# Patient Record
Sex: Female | Born: 1960 | Race: Black or African American | Hispanic: No | Marital: Single | Smoking: Never smoker
Health system: Southern US, Community
[De-identification: ages and names within clinical notes are randomized; demographics above are authoritative.]

## PROBLEM LIST (undated history)

## (undated) DIAGNOSIS — N200 Calculus of kidney: Secondary | ICD-10-CM

---

## 2000-02-15 ENCOUNTER — Other Ambulatory Visit: Admission: RE | Admit: 2000-02-15 | Discharge: 2000-02-15 | Payer: Self-pay | Admitting: Gynecology

## 2002-04-15 ENCOUNTER — Emergency Department (HOSPITAL_COMMUNITY): Admission: EM | Admit: 2002-04-15 | Discharge: 2002-04-15 | Payer: Self-pay | Admitting: Podiatry

## 2004-05-26 ENCOUNTER — Emergency Department (HOSPITAL_COMMUNITY): Admission: EM | Admit: 2004-05-26 | Discharge: 2004-05-26 | Payer: Self-pay | Admitting: Emergency Medicine

## 2004-09-22 ENCOUNTER — Encounter: Admission: RE | Admit: 2004-09-22 | Discharge: 2004-09-22 | Payer: Self-pay | Admitting: Emergency Medicine

## 2007-01-20 ENCOUNTER — Emergency Department (HOSPITAL_COMMUNITY): Admission: EM | Admit: 2007-01-20 | Discharge: 2007-01-20 | Payer: Self-pay | Admitting: Emergency Medicine

## 2008-04-16 ENCOUNTER — Emergency Department (HOSPITAL_COMMUNITY): Admission: EM | Admit: 2008-04-16 | Discharge: 2008-04-16 | Payer: Self-pay | Admitting: Emergency Medicine

## 2009-09-13 ENCOUNTER — Emergency Department (HOSPITAL_COMMUNITY): Admission: EM | Admit: 2009-09-13 | Discharge: 2009-09-14 | Payer: Self-pay | Admitting: Emergency Medicine

## 2009-10-25 ENCOUNTER — Emergency Department (HOSPITAL_COMMUNITY): Admission: EM | Admit: 2009-10-25 | Discharge: 2009-10-25 | Payer: Self-pay | Admitting: Emergency Medicine

## 2010-05-16 LAB — CBC
HCT: 36.1 % (ref 36.0–46.0)
MCH: 26.5 pg (ref 26.0–34.0)
MCHC: 32.8 g/dL (ref 30.0–36.0)
Platelets: 243 10*3/uL (ref 150–400)
WBC: 5.9 10*3/uL (ref 4.0–10.5)

## 2010-05-16 LAB — DIFFERENTIAL
Eosinophils Absolute: 0.1 10*3/uL (ref 0.0–0.7)
Monocytes Absolute: 0.5 10*3/uL (ref 0.1–1.0)
Monocytes Relative: 8 % (ref 3–12)
Neutrophils Relative %: 64 % (ref 43–77)

## 2010-05-16 LAB — BASIC METABOLIC PANEL
CO2: 27 mEq/L (ref 19–32)
Calcium: 8.5 mg/dL (ref 8.4–10.5)
Chloride: 104 mEq/L (ref 96–112)
Creatinine, Ser: 1.01 mg/dL (ref 0.4–1.2)
GFR calc non Af Amer: 58 mL/min — ABNORMAL LOW (ref >60)
Potassium: 4.5 mEq/L (ref 3.5–5.1)
Sodium: 136 mEq/L (ref 135–145)

## 2010-05-16 LAB — URINALYSIS, ROUTINE W REFLEX MICROSCOPIC
Glucose, UA: NEGATIVE mg/dL
Hgb urine dipstick: NEGATIVE
Ketones, ur: NEGATIVE mg/dL
Protein, ur: NEGATIVE mg/dL
Specific Gravity, Urine: 1.022 (ref 1.005–1.030)
Urobilinogen, UA: 0.2 mg/dL (ref 0.0–1.0)
pH: 6.5 (ref 5.0–8.0)

## 2010-12-08 LAB — URINALYSIS, ROUTINE W REFLEX MICROSCOPIC
Nitrite: NEGATIVE
Specific Gravity, Urine: 1.025
Urobilinogen, UA: 1
pH: 7.5

## 2010-12-08 LAB — POCT PREGNANCY, URINE
Operator id: 284251
Preg Test, Ur: NEGATIVE

## 2014-12-25 ENCOUNTER — Emergency Department (HOSPITAL_COMMUNITY)
Admission: EM | Admit: 2014-12-25 | Discharge: 2014-12-25 | Disposition: A | Discharge status: Home / Self Care | Payer: Self-pay | Attending: Emergency Medicine | Admitting: Emergency Medicine

## 2014-12-25 ENCOUNTER — Encounter (HOSPITAL_COMMUNITY): Payer: Self-pay | Admitting: *Deleted

## 2014-12-25 ENCOUNTER — Emergency Department (HOSPITAL_COMMUNITY): Payer: Self-pay

## 2014-12-25 DIAGNOSIS — Y92410 Unspecified street and highway as the place of occurrence of the external cause: Secondary | ICD-10-CM | POA: Insufficient documentation

## 2014-12-25 DIAGNOSIS — R51 Headache: Secondary | ICD-10-CM

## 2014-12-25 DIAGNOSIS — R519 Headache, unspecified: Secondary | ICD-10-CM

## 2014-12-25 DIAGNOSIS — Y998 Other external cause status: Secondary | ICD-10-CM | POA: Insufficient documentation

## 2014-12-25 DIAGNOSIS — S161XXA Strain of muscle, fascia and tendon at neck level, initial encounter: Secondary | ICD-10-CM | POA: Insufficient documentation

## 2014-12-25 DIAGNOSIS — Y9389 Activity, other specified: Secondary | ICD-10-CM | POA: Insufficient documentation

## 2014-12-25 DIAGNOSIS — S0990XA Unspecified injury of head, initial encounter: Secondary | ICD-10-CM | POA: Insufficient documentation

## 2014-12-25 MED ORDER — CYCLOBENZAPRINE HCL 5 MG PO TABS
5.0000 mg | ORAL_TABLET | Freq: Two times a day (BID) | ORAL | Status: DC | PRN
Start: 2014-12-25 — End: 2015-06-10

## 2014-12-25 MED ORDER — IBUPROFEN 400 MG PO TABS
600.0000 mg | ORAL_TABLET | Freq: Once | ORAL | Status: AC
  Administered 2014-12-25: 400 mg via ORAL
  Filled 2014-12-25: qty 2
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
## 2014-12-25 NOTE — Discharge Instructions (Signed)
Cervical Sprain  A cervical sprain is an injury in the neck in which the strong, fibrous tissues (ligaments) that connect your neck bones stretch or tear. Cervical sprains can range from mild to severe. Severe cervical sprains can cause the neck vertebrae to be unstable. This can lead to damage of the spinal cord and can result in serious nervous system problems. The amount of time it takes for a cervical sprain to get better depends on the cause and extent of the injury. Most cervical sprains heal in 1 to 3 weeks.  CAUSES   Severe cervical sprains may be caused by:    Contact sport injuries (such as from football, rugby, wrestling, hockey, auto racing, gymnastics, diving, martial arts, or boxing).    Motor vehicle collisions.    Whiplash injuries. This is an injury from a sudden forward and backward whipping movement of the head and neck.   Falls.   Mild cervical sprains may be caused by:    Being in an awkward position, such as while cradling a telephone between your ear and shoulder.    Sitting in a chair that does not offer proper support.    Working at a poorly designed computer station.    Looking up or down for long periods of time.   SYMPTOMS    Pain, soreness, stiffness, or a burning sensation in the front, back, or sides of the neck. This discomfort may develop immediately after the injury or slowly, 24 hours or more after the injury.    Pain or tenderness directly in the middle of the back of the neck.    Shoulder or upper back pain.    Limited ability to move the neck.    Headache.    Dizziness.    Weakness, numbness, or tingling in the hands or arms.    Muscle spasms.    Difficulty swallowing or chewing.    Tenderness and swelling of the neck.   DIAGNOSIS   Most of the time your health care provider can diagnose a cervical sprain by taking your history and doing a physical exam. Your health care provider will ask about previous neck injuries and any known neck  problems, such as arthritis in the neck. X-rays may be taken to find out if there are any other problems, such as with the bones of the neck. Other tests, such as a CT scan or MRI, may also be needed.   TREATMENT   Treatment depends on the severity of the cervical sprain. Mild sprains can be treated with rest, keeping the neck in place (immobilization), and pain medicines. Severe cervical sprains are immediately immobilized. Further treatment is done to help with pain, muscle spasms, and other symptoms and may include:   Medicines, such as pain relievers, numbing medicines, or muscle relaxants.    Physical therapy. This may involve stretching exercises, strengthening exercises, and posture training. Exercises and improved posture can help stabilize the neck, strengthen muscles, and help stop symptoms from returning.   HOME CARE INSTRUCTIONS    Put ice on the injured area.     Put ice in a plastic bag.     Place a towel between your skin and the bag.     Leave the ice on for 15-20 minutes, 3-4 times a day.    If your injury was severe, you may have been given a cervical collar to wear. A cervical collar is a two-piece collar designed to keep your neck from moving while it heals.      Do not remove the collar unless instructed by your health care provider.    If you have long hair, keep it outside of the collar.    Ask your health care provider before making any adjustments to your collar. Minor adjustments may be required over time to improve comfort and reduce pressure on your chin or on the back of your head.    Ifyou are allowed to remove the collar for cleaning or bathing, follow your health care provider's instructions on how to do so safely.    Keep your collar clean by wiping it with mild soap and water and drying it completely. If the collar you have been given includes removable pads, remove them every 1-2 days and hand wash them with soap and water. Allow them to air dry. They should be completely  dry before you wear them in the collar.    If you are allowed to remove the collar for cleaning and bathing, wash and dry the skin of your neck. Check your skin for irritation or sores. If you see any, tell your health care provider.    Do not drive while wearing the collar.    Only take over-the-counter or prescription medicines for pain, discomfort, or fever as directed by your health care provider.    Keep all follow-up appointments as directed by your health care provider.    Keep all physical therapy appointments as directed by your health care provider.    Make any needed adjustments to your workstation to promote good posture.    Avoid positions and activities that make your symptoms worse.    Warm up and stretch before being active to help prevent problems.   SEEK MEDICAL CARE IF:    Your pain is not controlled with medicine.    You are unable to decrease your pain medicine over time as planned.    Your activity level is not improving as expected.   SEEK IMMEDIATE MEDICAL CARE IF:    You develop any bleeding.   You develop stomach upset.   You have signs of an allergic reaction to your medicine.    Your symptoms get worse.    You develop new, unexplained symptoms.    You have numbness, tingling, weakness, or paralysis in any part of your body.   MAKE SURE YOU:    Understand these instructions.   Will watch your condition.   Will get help right away if you are not doing well or get worse.     This information is not intended to replace advice given to you by your health care provider. Make sure you discuss any questions you have with your health care provider.     Document Released: 12/13/2006 Document Revised: 02/20/2013 Document Reviewed: 08/23/2012  Elsevier Interactive Patient Education 2016 [employer]

## 2014-12-25 NOTE — ED Notes (Signed)
Patient transported to X-ray 
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
## 2014-12-25 NOTE — ED Notes (Signed)
Pt is in stable condition upon d/c and ambulates from ED. 

## 2015-06-10 ENCOUNTER — Ambulatory Visit (HOSPITAL_COMMUNITY)
Admission: EM | Admit: 2015-06-10 | Discharge: 2015-06-10 | Disposition: A | Discharge status: Home / Self Care | Payer: No Typology Code available for payment source | Attending: Family Medicine | Admitting: Family Medicine

## 2015-06-10 ENCOUNTER — Encounter (HOSPITAL_COMMUNITY): Payer: Self-pay | Admitting: Emergency Medicine

## 2015-06-10 DIAGNOSIS — M545 Low back pain, unspecified: Secondary | ICD-10-CM

## 2015-06-10 LAB — POCT URINALYSIS DIP (DEVICE)
BILIRUBIN URINE: NEGATIVE
GLUCOSE, UA: NEGATIVE mg/dL
Hgb urine dipstick: NEGATIVE
KETONES UR: NEGATIVE mg/dL
Nitrite: NEGATIVE
Protein, ur: NEGATIVE mg/dL
Specific Gravity, Urine: 1.02 (ref 1.005–1.030)
Urobilinogen, UA: 0.2 mg/dL (ref 0.0–1.0)
pH: 6 (ref 5.0–8.0)

## 2015-06-10 MED ORDER — CYCLOBENZAPRINE HCL 5 MG PO TABS: 5.0000 mg | ORAL_TABLET | Freq: Three times a day (TID) | ORAL | Status: DC | PRN

## 2015-06-10 NOTE — ED Provider Notes (Signed)
CSN: [id number]     Arrival date & time 06/10/15  1257 History   First MD Initiated Contact with Patient 06/10/15 1312     Chief Complaint  Patient presents with  . Back Pain   (Consider location/radiation/quality/duration/timing/severity/associated sxs/prior Treatment) Patient is a 55 y.o. female presenting with back pain. The history is provided by the patient.  Back Pain Location:  Lumbar spine Quality:  Stiffness Radiates to:  Does not radiate Pain severity:  Mild Onset quality:  Gradual Duration:  3 days Progression:  Unchanged Chronicity:  New Context: not lifting heavy objects and not recent injury   Worsened by:  Nothing tried Ineffective treatments:  None tried Associated symptoms: no abdominal pain, no bladder incontinence, no bowel incontinence, no dysuria, no fever, no leg pain, no numbness, no paresthesias, no perianal numbness, no tingling and no weakness     History reviewed. No pertinent past medical history. History reviewed. No pertinent past surgical history. No family history on file. Social History  Substance Use Topics  . Smoking status: Never Smoker   . Smokeless tobacco: None  . Alcohol Use: No   OB History    No data available     Review of Systems  Constitutional: Negative.  Negative for fever.  Gastrointestinal: Negative.  Negative for abdominal pain and bowel incontinence.  Genitourinary: Negative.  Negative for bladder incontinence and dysuria.  Musculoskeletal: Positive for back pain. Negative for myalgias and gait problem.  Skin: Negative.   Neurological: Negative for tingling, weakness, numbness and paresthesias.  All other systems reviewed and are negative.   Allergies  Review of patient's allergies indicates no known allergies.  Home Medications   Prior to Admission medications   Medication Sig Start Date End Date Taking? Authorizing Provider  cyclobenzaprine (FLEXERIL) 5 MG tablet Take 1 tablet (5 mg total) by mouth 3 (three)  times daily as needed for muscle spasms. 06/10/15   Linna HoffJames D Erickson Yamashiro, MD   Meds Ordered and Administered this Visit  Medications - No data to display  BP 142/89 mmHg  Pulse 104  Temp(Src) 98 F (36.7 C) (Oral)  Resp 16  SpO2 98% No data found.   Physical Exam  Constitutional: She is oriented to person, place, and time. She appears well-developed and well-nourished. No distress.  Abdominal: Soft. Bowel sounds are normal.  Musculoskeletal: She exhibits tenderness.       Lumbar back: She exhibits tenderness, pain and spasm. She exhibits no bony tenderness, no swelling, no edema and normal pulse.       Back:  Neurological: She is alert and oriented to person, place, and time.  Skin: Skin is warm and dry.  Nursing note and vitals reviewed.   ED Course  Procedures (including critical care time)  Labs Review Labs Reviewed  POCT URINALYSIS DIP (DEVICE) - Abnormal; Notable for the following:    Leukocytes, UA TRACE (*)    All other components within normal limits    Imaging Review No results found.   Visual Acuity Review  Right Eye Distance:   Left Eye Distance:   Bilateral Distance:    Right Eye Near:   Left Eye Near:    Bilateral Near:         MDM   1. Back pain at L4-L5 level        Linna HoffJames D Rylinn Linzy, MD 06/12/15 2144

## 2015-06-10 NOTE — ED Notes (Signed)
Here with 3 days right side back pain Steady dull ache pain noted Denies hematuria or n,v

## 2018-08-02 ENCOUNTER — Emergency Department (HOSPITAL_COMMUNITY)
Admission: EM | Admit: 2018-08-02 | Discharge: 2018-08-03 | Disposition: A | Discharge status: Home / Self Care | Payer: Self-pay | Attending: Emergency Medicine | Admitting: Emergency Medicine

## 2018-08-02 ENCOUNTER — Other Ambulatory Visit: Payer: Self-pay

## 2018-08-02 ENCOUNTER — Encounter (HOSPITAL_COMMUNITY): Payer: Self-pay | Admitting: Emergency Medicine

## 2018-08-02 DIAGNOSIS — K2960 Other gastritis without bleeding: Secondary | ICD-10-CM | POA: Insufficient documentation

## 2018-08-02 HISTORY — DX: Calculus of kidney: N20.0

## 2018-08-02 LAB — COMPREHENSIVE METABOLIC PANEL
ALT: 48 U/L — ABNORMAL HIGH (ref 0–44)
AST: 53 U/L — ABNORMAL HIGH (ref 15–41)
Albumin: 3.7 g/dL (ref 3.5–5.0)
Alkaline Phosphatase: 81 U/L (ref 38–126)
Anion gap: 9 (ref 5–15)
BUN: 16 mg/dL (ref 6–20)
CO2: 25 mmol/L (ref 22–32)
Calcium: 9 mg/dL (ref 8.9–10.3)
Chloride: 104 mmol/L (ref 98–111)
Creatinine, Ser: 0.96 mg/dL (ref 0.44–1.00)
GFR calc Af Amer: >60 mL/min (ref >60)
GFR calc non Af Amer: >60 mL/min (ref >60)
Glucose, Bld: 109 mg/dL — ABNORMAL HIGH (ref 70–99)
Potassium: 4.2 mmol/L (ref 3.5–5.1)
Sodium: 138 mmol/L (ref 135–145)
Total Bilirubin: 0.4 mg/dL (ref 0.3–1.2)
Total Protein: 7.4 g/dL (ref 6.5–8.1)

## 2018-08-02 LAB — URINALYSIS, ROUTINE W REFLEX MICROSCOPIC
Bilirubin Urine: NEGATIVE
Glucose, UA: NEGATIVE mg/dL
Hgb urine dipstick: NEGATIVE
Ketones, ur: NEGATIVE mg/dL
Nitrite: NEGATIVE
Protein, ur: NEGATIVE mg/dL
Specific Gravity, Urine: 1.02 (ref 1.005–1.030)
pH: 6 (ref 5.0–8.0)

## 2018-08-02 LAB — CBC
HCT: 37.7 % (ref 36.0–46.0)
Hemoglobin: 11.5 g/dL — ABNORMAL LOW (ref 12.0–15.0)
MCH: 25.1 pg — ABNORMAL LOW (ref 26.0–34.0)
MCHC: 30.5 g/dL (ref 30.0–36.0)
MCV: 82.1 fL (ref 80.0–100.0)
Platelets: 301 10*3/uL (ref 150–400)
RBC: 4.59 MIL/uL (ref 3.87–5.11)
RDW: 14.3 % (ref 11.5–15.5)
WBC: 3.7 10*3/uL — ABNORMAL LOW (ref 4.0–10.5)
nRBC: 0 % (ref 0.0–0.2)

## 2018-08-02 LAB — I-STAT BETA HCG BLOOD, ED (MC, WL, AP ONLY): I-stat hCG, quantitative: <5 m[IU]/mL (ref <5)

## 2018-08-02 LAB — LIPASE, BLOOD: Lipase: 29 U/L (ref 11–51)

## 2018-08-02 MED ORDER — LIDOCAINE VISCOUS HCL 2 % MT SOLN
15.0000 mL | Freq: Once | OROMUCOSAL | Status: AC
  Administered 2018-08-02: 15 mL via ORAL
  Filled 2018-08-02: qty 15

## 2018-08-02 MED ORDER — ALUM & MAG HYDROXIDE-SIMETH 200-200-20 MG/5ML PO SUSP
30.0000 mL | Freq: Once | ORAL | Status: AC
  Administered 2018-08-02: 30 mL via ORAL
  Filled 2018-08-02: qty 30

## 2018-08-02 MED ORDER — SODIUM CHLORIDE 0.9% FLUSH: 3.0000 mL | Freq: Once | INTRAVENOUS | Status: DC

## 2018-08-02 NOTE — ED Provider Notes (Signed)
[facility] EMERGENCY DEPARTMENT Provider Note   CSN: [id number] Arrival date & time: 08/02/18  1642    History   Chief Complaint Chief Complaint  Patient presents with  . Abdominal Pain    HPI Jasmine Lane is a 58 y.o. female.     Patient without other medical history presents with epigastric pain x at least 2 weeks. The pain fluctuates, coming and going, without identified pattern or modifying factors.  She denies chest pain, SOB, cough, fever. The pain is sharp and radiates to back. No nausea or vomiting. She has been taking Mylanta and TUMs with decreasing relief. She reports increased belching. She describes history of burning sensation in her throat that affected her voice and over time this migrated to her epigastrium.   The history is provided by the patient. No language interpreter was used.  Abdominal Pain  Associated symptoms: no chest pain, no chills, no constipation, no cough, no diarrhea, no fever, no nausea, no shortness of breath and no vomiting     Past Medical History:  Diagnosis Date  . Kidney stone     There are no active problems to display for this patient.   History reviewed. No pertinent surgical history.   OB History   No obstetric history on file.      Home Medications    Prior to Admission medications   Medication Sig Start Date End Date Taking? Authorizing Provider  cyclobenzaprine (FLEXERIL) 5 MG tablet Take 1 tablet (5 mg total) by mouth 3 (three) times daily as needed for muscle spasms. 06/10/15   Linna Hoff, MD    Family History No family history on file.  Social History Social History   Tobacco Use  . Smoking status: Never Smoker  . Smokeless tobacco: Never Used  Substance Use Topics  . Alcohol use: No  . Drug use: No     Allergies   Patient has no known allergies.   Review of Systems Review of Systems  Constitutional: Negative for chills and fever.  HENT: Negative.   Respiratory: Negative.   Negative for cough and shortness of breath.   Cardiovascular: Negative.  Negative for chest pain.  Gastrointestinal: Positive for abdominal pain. Negative for constipation, diarrhea, nausea and vomiting.  Musculoskeletal: Negative.   Skin: Negative.   Neurological: Negative.      Physical Exam Updated Vital Signs BP (!) 157/84 (BP Location: Right Arm)   Pulse 79   Temp 97.9 F (36.6 C) (Oral)   Resp 18   SpO2 99%   Physical Exam Vitals signs and nursing note reviewed.  Constitutional:      Appearance: She is well-developed.  HENT:     Head: Normocephalic.  Neck:     Musculoskeletal: Normal range of motion and neck supple.  Cardiovascular:     Rate and Rhythm: Normal rate and regular rhythm.  Pulmonary:     Effort: Pulmonary effort is normal.     Breath sounds: Normal breath sounds.  Abdominal:     General: Bowel sounds are normal. There is no distension.     Palpations: Abdomen is soft.     Tenderness: There is abdominal tenderness in the epigastric area. There is no guarding or rebound.     Comments: Tenderness is limited to epigastric area. Specifically, no RUQ tenderness.   Musculoskeletal: Normal range of motion.  Skin:    General: Skin is warm and dry.     Findings: No rash.  Neurological:  Mental Status: She is alert.     Cranial Nerves: No cranial nerve deficit.      ED Treatments / Results  Labs (all labs ordered are listed, but only abnormal results are displayed) Labs Reviewed  COMPREHENSIVE METABOLIC PANEL - Abnormal; Notable for the following components:      Result Value   Glucose, Bld 109 (*)    AST 53 (*)    ALT 48 (*)    All other components within normal limits  CBC - Abnormal; Notable for the following components:   WBC 3.7 (*)    Hemoglobin 11.5 (*)    MCH 25.1 (*)    All other components within normal limits  URINALYSIS, ROUTINE W REFLEX MICROSCOPIC - Abnormal; Notable for the following components:   Leukocytes,Ua SMALL (*)     Bacteria, UA RARE (*)    All other components within normal limits  LIPASE, BLOOD  I-STAT BETA HCG BLOOD, ED ([facility], [facility], [facility] ONLY)    EKG None  Radiology No results found.  Procedures Procedures (including critical care time)  Medications Ordered in ED Medications  sodium chloride flush (NS) 0.9 % injection 3 mL (has no administration in time range)     Initial Impression / Assessment and Plan / ED Course  I have reviewed the triage vital signs and the nursing notes.  Pertinent labs & imaging results that were available during my care of the patient were reviewed by me and considered in my medical decision making (see chart for details).        Patient to ED with 2 weeks of epigastric pain, increased belching, radiation to back. No cough, CP, SOB. Taking TUMs and Mylanta with decreasing benefit.   EKG shows nonspecific t-wave inversion. Troponin negative. Labs otherwise unremarkable. ACS unlikely. There is mild elevation of liver enzymes. She is nontender over her gall bladder.   GI Cocktail provided moderate relief. Given her symptoms of belching, radiation to back, history of globus sensation, feel reflux is likely. Will start on Protonix daily, Pepcid prn. Recommended PCP follow up - will provide resources.  Final Clinical Impressions(s) / ED Diagnoses   Final diagnoses:  None   1. Reflux   ED Discharge Orders    None       Elpidio AnisUpstill, Lino Wickliff, PA-C 08/03/18 0102    Pricilla LovelessGoldston, Scott, MD 08/10/18 [phone]

## 2018-08-02 NOTE — ED Triage Notes (Signed)
Pt c/o generalized abdominal pain that radiates to her back x 2 weeks. Pt tried OTC meds with no relief. Denies nausea/vomiting/diarrhea.

## 2018-08-03 LAB — TROPONIN I: Troponin I: <0.03 ng/mL (ref <0.03)

## 2018-08-03 MED ORDER — FAMOTIDINE 20 MG PO TABS: 20.0000 mg | ORAL_TABLET | Freq: Two times a day (BID) | ORAL | 0 refills | Status: DC | PRN

## 2018-08-03 MED ORDER — PANTOPRAZOLE SODIUM 20 MG PO TBEC: 20.0000 mg | DELAYED_RELEASE_TABLET | Freq: Every day | ORAL | 1 refills | Status: DC

## 2018-08-03 NOTE — Discharge Instructions (Signed)
Take Protonix daily, and Pepcid up to twice daily as needed.   Follow up with a primary care provider of your choice for further outpatient management. Follow diet recommendations as provided in your discharge instructions.   Return to the emergency department if you have any bloody vomit, high fever, severe pain or new concern.

## 2018-11-26 ENCOUNTER — Emergency Department (HOSPITAL_COMMUNITY)
Admission: EM | Admit: 2018-11-26 | Discharge: 2018-11-27 | Disposition: A | Discharge status: Home / Self Care | Payer: Self-pay | Attending: Emergency Medicine | Admitting: Emergency Medicine

## 2018-11-26 ENCOUNTER — Other Ambulatory Visit: Payer: Self-pay

## 2018-11-26 ENCOUNTER — Encounter (HOSPITAL_COMMUNITY): Payer: Self-pay | Admitting: *Deleted

## 2018-11-26 ENCOUNTER — Emergency Department (HOSPITAL_COMMUNITY): Payer: Self-pay

## 2018-11-26 DIAGNOSIS — J370 Chronic laryngitis: Secondary | ICD-10-CM | POA: Insufficient documentation

## 2018-11-26 DIAGNOSIS — J383 Other diseases of vocal cords: Secondary | ICD-10-CM | POA: Insufficient documentation

## 2018-11-26 LAB — CBC
HCT: 40.6 % (ref 36.0–46.0)
Hemoglobin: 12.4 g/dL (ref 12.0–15.0)
MCH: 25.3 pg — ABNORMAL LOW (ref 26.0–34.0)
MCHC: 30.5 g/dL (ref 30.0–36.0)
MCV: 82.7 fL (ref 80.0–100.0)
Platelets: 279 10*3/uL (ref 150–400)
RBC: 4.91 MIL/uL (ref 3.87–5.11)
RDW: 14.1 % (ref 11.5–15.5)
WBC: 4.9 10*3/uL (ref 4.0–10.5)
nRBC: 0 % (ref 0.0–0.2)

## 2018-11-26 LAB — BASIC METABOLIC PANEL
Anion gap: 10 (ref 5–15)
BUN: 17 mg/dL (ref 6–20)
CO2: 24 mmol/L (ref 22–32)
Calcium: 8.8 mg/dL — ABNORMAL LOW (ref 8.9–10.3)
Chloride: 101 mmol/L (ref 98–111)
Creatinine, Ser: 1.09 mg/dL — ABNORMAL HIGH (ref 0.44–1.00)
GFR calc Af Amer: >60 mL/min (ref >60)
GFR calc non Af Amer: 56 mL/min — ABNORMAL LOW (ref >60)
Glucose, Bld: 110 mg/dL — ABNORMAL HIGH (ref 70–99)
Potassium: 4.4 mmol/L (ref 3.5–5.1)
Sodium: 135 mmol/L (ref 135–145)

## 2018-11-26 LAB — D-DIMER, QUANTITATIVE: D-Dimer, Quant: <0.27 ug/mL-FEU (ref 0.00–0.50)

## 2018-11-26 LAB — GROUP A STREP BY PCR: Group A Strep by PCR: NOT DETECTED

## 2018-11-26 MED ORDER — DEXAMETHASONE SODIUM PHOSPHATE 10 MG/ML IJ SOLN
10.0000 mg | Freq: Once | INTRAMUSCULAR | Status: AC
  Administered 2018-11-27: 01:00:00 10 mg via INTRAMUSCULAR
  Filled 2018-11-26: qty 1

## 2018-11-26 NOTE — Discharge Instructions (Addendum)
Your tests look normal A steroid shot was given. You should follow with the doctor above or the doctor of your choice.  See the GI doctor for follow up on your aspiration

## 2018-11-26 NOTE — ED Triage Notes (Signed)
Pt reports sore throat for about 2-3 weeks, no fevers. Reports she is tested weekly for COVID at her work. Pt also having pain in the right calf about 3 weeks ago that now has in the posterior knee, associated with swelling. Has had shortness of breath for several months, worse with activity.

## 2018-11-26 NOTE — ED Provider Notes (Addendum)
[facility] EMERGENCY DEPARTMENT Provider Note   CSN: [id number] Arrival date & time: 11/26/18  1808     History   Chief Complaint Chief Complaint  Patient presents with  . Sore Throat    HPI Jasmine Lane is a 58 y.o. female.     HPI  This patient is a 58 year old female, presents to the hospital with a complaint of laryngitis which is been going on for approximately 1 year.  She reports that she has had chronic sx that are not getting any better - it is associated with a difficult time swallowing and she states that occasionally she feels like she is aspirating when she swallows.  She has absolutely no shortness of breath or coughing.  She denies a sore throat, denies a headache, denies any fevers or chills.  She also reports that she had a fall couple months ago injuring the outside of her right lower extremity in the mid calf, she then started to limp and then developed right knee pain after that.  She now feels like the pain is behind her right knee.  She does not have any history of DVT or other risk for it either.  She has not been worked up for any of these complaints as she states that she is uninsured.  She has had difficulty with the hoarseness before and has been given a steroid shot which seemed to help transiently.  She does not smoke anymore as she did many years ago, no other tobacco liquid or chew.  Past Medical History:  Diagnosis Date  . Kidney stone     There are no active problems to display for this patient.   History reviewed. No pertinent surgical history.   OB History   No obstetric history on file.      Home Medications    Prior to Admission medications   Medication Sig Start Date End Date Taking? Authorizing Provider  cyclobenzaprine (FLEXERIL) 5 MG tablet Take 1 tablet (5 mg total) by mouth 3 (three) times daily as needed for muscle spasms. 06/10/15   Linna Hoff, MD  famotidine (PEPCID) 20 MG tablet Take 1 tablet (20 mg  total) by mouth 2 (two) times daily as needed for heartburn or indigestion. 08/03/18   Elpidio Anis, PA-C  pantoprazole (PROTONIX) 20 MG tablet Take 1 tablet (20 mg total) by mouth daily. 08/03/18   Elpidio Anis, PA-C    Family History No family history on file.  Social History Social History   Tobacco Use  . Smoking status: Never Smoker  . Smokeless tobacco: Never Used  Substance Use Topics  . Alcohol use: No  . Drug use: No     Allergies   Patient has no known allergies.   Review of Systems Review of Systems  All other systems reviewed and are negative.    Physical Exam Updated Vital Signs BP (!) 159/89   Pulse 87   Temp 98.3 F (36.8 C) (Oral)   Resp (!) 22   Ht 1.702 m (5\' 7" )   Wt 102.1 kg   SpO2 99%   BMI 35.24 kg/m   Physical Exam Vitals signs and nursing note reviewed.  Constitutional:      General: She is not in acute distress.    Appearance: She is well-developed.  HENT:     Head: Normocephalic and atraumatic.     Mouth/Throat:     Pharynx: No oropharyngeal exudate.     Tonsils: No tonsillar exudate or tonsillar  abscesses.     Comments: Normal-appearing posterior pharynx, hoarseness of the voice is present Eyes:     General: No scleral icterus.       Right eye: No discharge.        Left eye: No discharge.     Conjunctiva/sclera: Conjunctivae normal.     Pupils: Pupils are equal, round, and reactive to light.  Neck:     Musculoskeletal: Normal range of motion and neck supple.     Thyroid: No thyromegaly.     Vascular: No JVD.  Cardiovascular:     Rate and Rhythm: Normal rate and regular rhythm.     Heart sounds: Normal heart sounds. No murmur. No friction rub. No gallop.   Pulmonary:     Effort: Pulmonary effort is normal. No respiratory distress.     Breath sounds: Normal breath sounds. No wheezing or rales.  Abdominal:     General: Bowel sounds are normal. There is no distension.     Palpations: Abdomen is soft. There is no mass.      Tenderness: There is no abdominal tenderness.  Musculoskeletal: Normal range of motion.        General: No tenderness.     Comments: The patient has mild tenderness with range of motion of the right knee but no objective swelling, no edema of the legs bilaterally.  Lymphadenopathy:     Cervical: No cervical adenopathy.  Skin:    General: Skin is warm and dry.     Findings: No erythema or rash.  Neurological:     Mental Status: She is alert.     Coordination: Coordination normal.  Psychiatric:        Behavior: Behavior normal.      ED Treatments / Results  Labs (all labs ordered are listed, but only abnormal results are displayed) Labs Reviewed  BASIC METABOLIC PANEL - Abnormal; Notable for the following components:      Result Value   Glucose, Bld 110 (*)    Creatinine, Ser 1.09 (*)    Calcium 8.8 (*)    GFR calc non Af Amer 56 (*)    All other components within normal limits  CBC - Abnormal; Notable for the following components:   MCH 25.3 (*)    All other components within normal limits  GROUP A STREP BY PCR  D-DIMER, QUANTITATIVE (NOT AT [facility])    EKG None  Radiology Dg Chest 2 View  Result Date: 11/26/2018 CLINICAL DATA:  Shortness of breath, sore throat for 2-3 weeks EXAM: CHEST - 2 VIEW COMPARISON:  None. FINDINGS: Atelectasis in the lung bases. No consolidation, features of edema, pneumothorax, or effusion. Pulmonary vascularity is normally distributed. The cardiomediastinal contours are unremarkable. No acute osseous or soft tissue abnormality. IMPRESSION: Atelectasis, otherwise no acute cardiopulmonary abnormality. Electronically Signed   By: Lovena Le M.D.   On: 11/26/2018 19:52    Procedures Procedures (including critical care time)  Medications Ordered in ED Medications - No data to display   Initial Impression / Assessment and Plan / ED Course  I have reviewed the triage vital signs and the nursing notes.  Pertinent labs & imaging results that  were available during my care of the patient were reviewed by me and considered in my medical decision making (see chart for details).       This patient appears very well, she has normal vital signs except for some mild hypertension but no fever, no tachycardia, no hypoxia.  She has no  findings of infection in her throat with a negative strep test and no leukocytosis suggesting this is not a bacterial cause however the chronic laryngitis could be from a laryngeal mass or tumor or other cause.  Will obtain a CT scan of the neck to further verify, will also obtain a d-dimer.  She may need outpatient ultrasound of the legs if in fact she is positive with a d-dimer.  She has no chest pain or shortness of breath or signs of pulmonary embolism.  Change of shift - care signed out to Dr. Eudelia Bunchardama  D dimer negative  Final Clinical Impressions(s) / ED Diagnoses   Final diagnoses:  None    ED Discharge Orders    None       Eber HongMiller, Aleanna Menge, MD 11/26/18 [id number]    Eber HongMiller, Benigno Check, MD 11/26/18 [phone]

## 2018-11-27 ENCOUNTER — Encounter: Payer: Self-pay | Admitting: *Deleted

## 2018-11-27 ENCOUNTER — Emergency Department (HOSPITAL_COMMUNITY): Payer: Self-pay

## 2018-11-27 MED ORDER — IOHEXOL 300 MG/ML  SOLN
75.0000 mL | Freq: Once | INTRAMUSCULAR | Status: AC | PRN
  Administered 2018-11-27: 02:00:00 75 mL via INTRAVENOUS

## 2018-11-27 NOTE — ED Notes (Signed)
EDP explained tests results and plan of care to pt.  

## 2018-11-27 NOTE — ED Provider Notes (Signed)
I assumed care of this patient from Dr. Sabra Heck.  Please see their note for further details of Hx, PE.  Briefly patient is a 58 y.o. female who presented with chronic laryngitis, pending CT neck.   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 No obstruction on exam. ENT follow up. Case management consult to assist with financial resources and follow up.  The patient is safe for discharge with strict return precautions.  The patient appears reasonably screened and/or stabilized for discharge and I doubt any other medical condition or other [facility] requiring further screening, evaluation, or treatment in the ED at this time prior to discharge.   The patient appears reasonably screened and/or stabilized for discharge and I doubt any other medical condition or other [facility] requiring further screening, evaluation, or treatment in the ED at this time prior to discharge.  Disposition: Discharge  Condition: Good  I have discussed the results, Dx and Tx plan with the patient who expressed understanding and agree(s) with the plan. Discharge instructions discussed at great length. The patient was given strict return precautions who verbalized understanding of the instructions. No further questions at time of discharge.    ED Discharge Orders    None       Follow Up: [facility] [place] [id number] [phone] In 1 week   Gastroenterology, [address] [place] [address] [phone]  In 1 week   [place] [place], [address] [place] [address] [phone]  Schedule an appointment as soon as possible for a visit  For close follow up to assess for vocal cord mass       Cardama, Grayce Sessions, MD 11/27/18 [phone]

## 2018-11-27 NOTE — Progress Notes (Signed)
EDCM noted CM consult.  Reviewed chart and basis for consult are unclear as the CM consult order is incomplete and the EDP discharge note does not mention CM instructions.    

## 2018-12-15 ENCOUNTER — Ambulatory Visit: Payer: Self-pay | Admitting: *Deleted

## 2018-12-15 NOTE — Telephone Encounter (Signed)
Patient was exposed to [place] at work and she has tested + for [place]- patient does not have PCP and has concerns and questions. Discussed concerns and patient advised to call back if needed. Reason for Disposition . [1] [id number] diagnosed by positive lab test AND [2] mild symptoms (e.g., cough, fever, others) AND [7] no complications or SOB  Answer Assessment - Initial Assessment Questions 1. COVID-19 DIAGNOSIS: "Who made your Coronavirus (COVID-19) diagnosis?" "Was it confirmed by a positive lab test?" If not diagnosed by a HCP, ask "Are there lots of cases (community spread) where you live?" (See public health department website, if unsure)     Workplace- [employer] 2. ONSET: "When did the COVID-19 symptoms start?"      Last night 3. WORST SYMPTOM: "What is your worst symptom?" (e.g., cough, fever, shortness of breath, muscle aches)     Headache, sore throat 4. COUGH: "Do you have a cough?" If so, ask: "How bad is the cough?"       Cough- it was bad- but it is gone now 5. FEVER: "Do you have a fever?" If so, ask: "What is your temperature, how was it measured, and when did it start?"     No- last night she had flu like symptoms 6. RESPIRATORY STATUS: "Describe your breathing?" (e.g., shortness of breath, wheezing, unable to speak)      Ok- but has noticed SOB with exertion 7. BETTER-SAME-WORSE: "Are you getting better, staying the same or getting worse compared to yesterday?"  If getting worse, ask, "In what way?"     same 8. HIGH RISK DISEASE: "Do you have any chronic medical problems?" (e.g., asthma, heart or lung disease, weak immune system, etc.)     no 9. PREGNANCY: "Is there any chance you are pregnant?" "When was your last menstrual period?"     n/a 10. OTHER SYMPTOMS: "Do you have any other symptoms?"  (e.g., chills, fatigue, headache, loss of smell or taste, muscle pain, sore throat)       Headache, flu symptoms-fatigue, sore throat, muscle pain  Protocols used: CORONAVIRUS  (COVID-19) DIAGNOSED OR SUSPECTED-A-AH

## 2019-01-18 ENCOUNTER — Other Ambulatory Visit: Payer: Self-pay

## 2019-01-18 ENCOUNTER — Ambulatory Visit: Payer: Self-pay | Attending: Internal Medicine | Admitting: Internal Medicine

## 2019-01-18 DIAGNOSIS — R490 Dysphonia: Secondary | ICD-10-CM | POA: Insufficient documentation

## 2019-01-18 NOTE — Progress Notes (Signed)
Virtual Visit via Telephone Note Due to current restrictions/limitations of in-office visits due to the COVID-19 pandemic, this scheduled clinical appointment was converted to a telehealth visit  I connected with Jasmine Lane on 01/18/19 at 2:05 p.m by telephone and verified that I am speaking with the correct person using two identifiers. I am in my office.  The patient is at home.  Only the patient and myself participated in this encounter.  I discussed the limitations, risks, security and privacy concerns of performing an evaluation and management service by telephone and the availability of in person appointments. I also discussed with the patient that there may be a patient responsible charge related to this service. The patient expressed understanding and agreed to proceed.   History of Present Illness: No previous PCP.  She is following up for evaluation of hoarseness. Pt seen in ER 11/27/2018 with 1 yr hx of hoarseness.  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  Radiology recommended referral to ENT for direct visualization and sampling.  Cervical lymph nodes were normal in size.  The thyroid gland appeared normal on the imaging study. Seen by ENT   Dr. Wilburn Cornelia 12/05/2018 and flex laryngoscopy revealed no supraglottic laryngeal mass but some changes consistent with chronic reflux.  Reflux precautions given and patient advised to take a PPI which could be purchased over-the-counter.  Plan is for follow-up in 2 months for repeat laryngoscope.  Patient states that she has been on omeprazole 40 mg daily and does not feel that it has made a difference.  She states that she had called for a follow-up appointment with Dr. Wilburn Cornelia and was told that it would be a telephone visit.  However the day of the appointment she did not receive a call.  She called him back today and spoke with his nurse who told her that she had missed her appointment.  She explained  to the nurse that there was a miscommunication because she was told it was a telephone visit.  Nurse was going to get back to her with an appointment. Sometimes she has problems swallowing liquids but it has improved.  No problems swallowing solids Loss some wgh during COVID infection 1 mth ago but gained it back. Former smoker.  Quit 24 yrs ago.  Does not drink ETOH beverages  Surg hx:  Appendectomy Fxh: dad died from complication of trachea.  Patient thinks he had a mass in his throat   Observations/Objective: No direct observation done as this was a telephone encounter.  Records in EMR and Care Everywhere reviewed  Assessment and Plan: 1. Hoarseness of voice Chronic and progressive for 1 year. I explained to patient how to apply for the orange card/cone discount.  Once she has that if she wished to get a second opinion we certainly can refer her to another ENT for second opinion.  At that time we can also get a CT of the chest to see if any lesions in the chest to explain her symptoms. - TSH; Future   Follow Up Instructions: 4-6 wks   I discussed the assessment and treatment plan with the patient. The patient was provided an opportunity to ask questions and all were answered. The patient agreed with the plan and demonstrated an understanding of the instructions.   The patient was advised to call back or seek an in-person evaluation if the symptoms worsen or if the condition fails to improve as anticipated.  I provided 18 minutes  of non-face-to-face time during this encounter.   Karle Plumber, MD

## 2019-01-18 NOTE — Progress Notes (Signed)
Pt states she has been horse for over a year   Pt states when she went to the hospital a couple months ago she had a CT done and it showed a mass on her vocal cord and she followed up with Dr. Wilburn Cornelia and he didn't see a mass

## 2019-03-20 ENCOUNTER — Ambulatory Visit: Payer: Medicaid Other | Admitting: Internal Medicine

## 2020-06-21 IMAGING — CR DG CHEST 2V
2 series · 2 of 2 positions shown · non-contrast
Comparison: None.

CLINICAL DATA: Shortness of breath, sore throat for 2-3 weeks

EXAM:
CHEST - 2 VIEW

[chest pa]
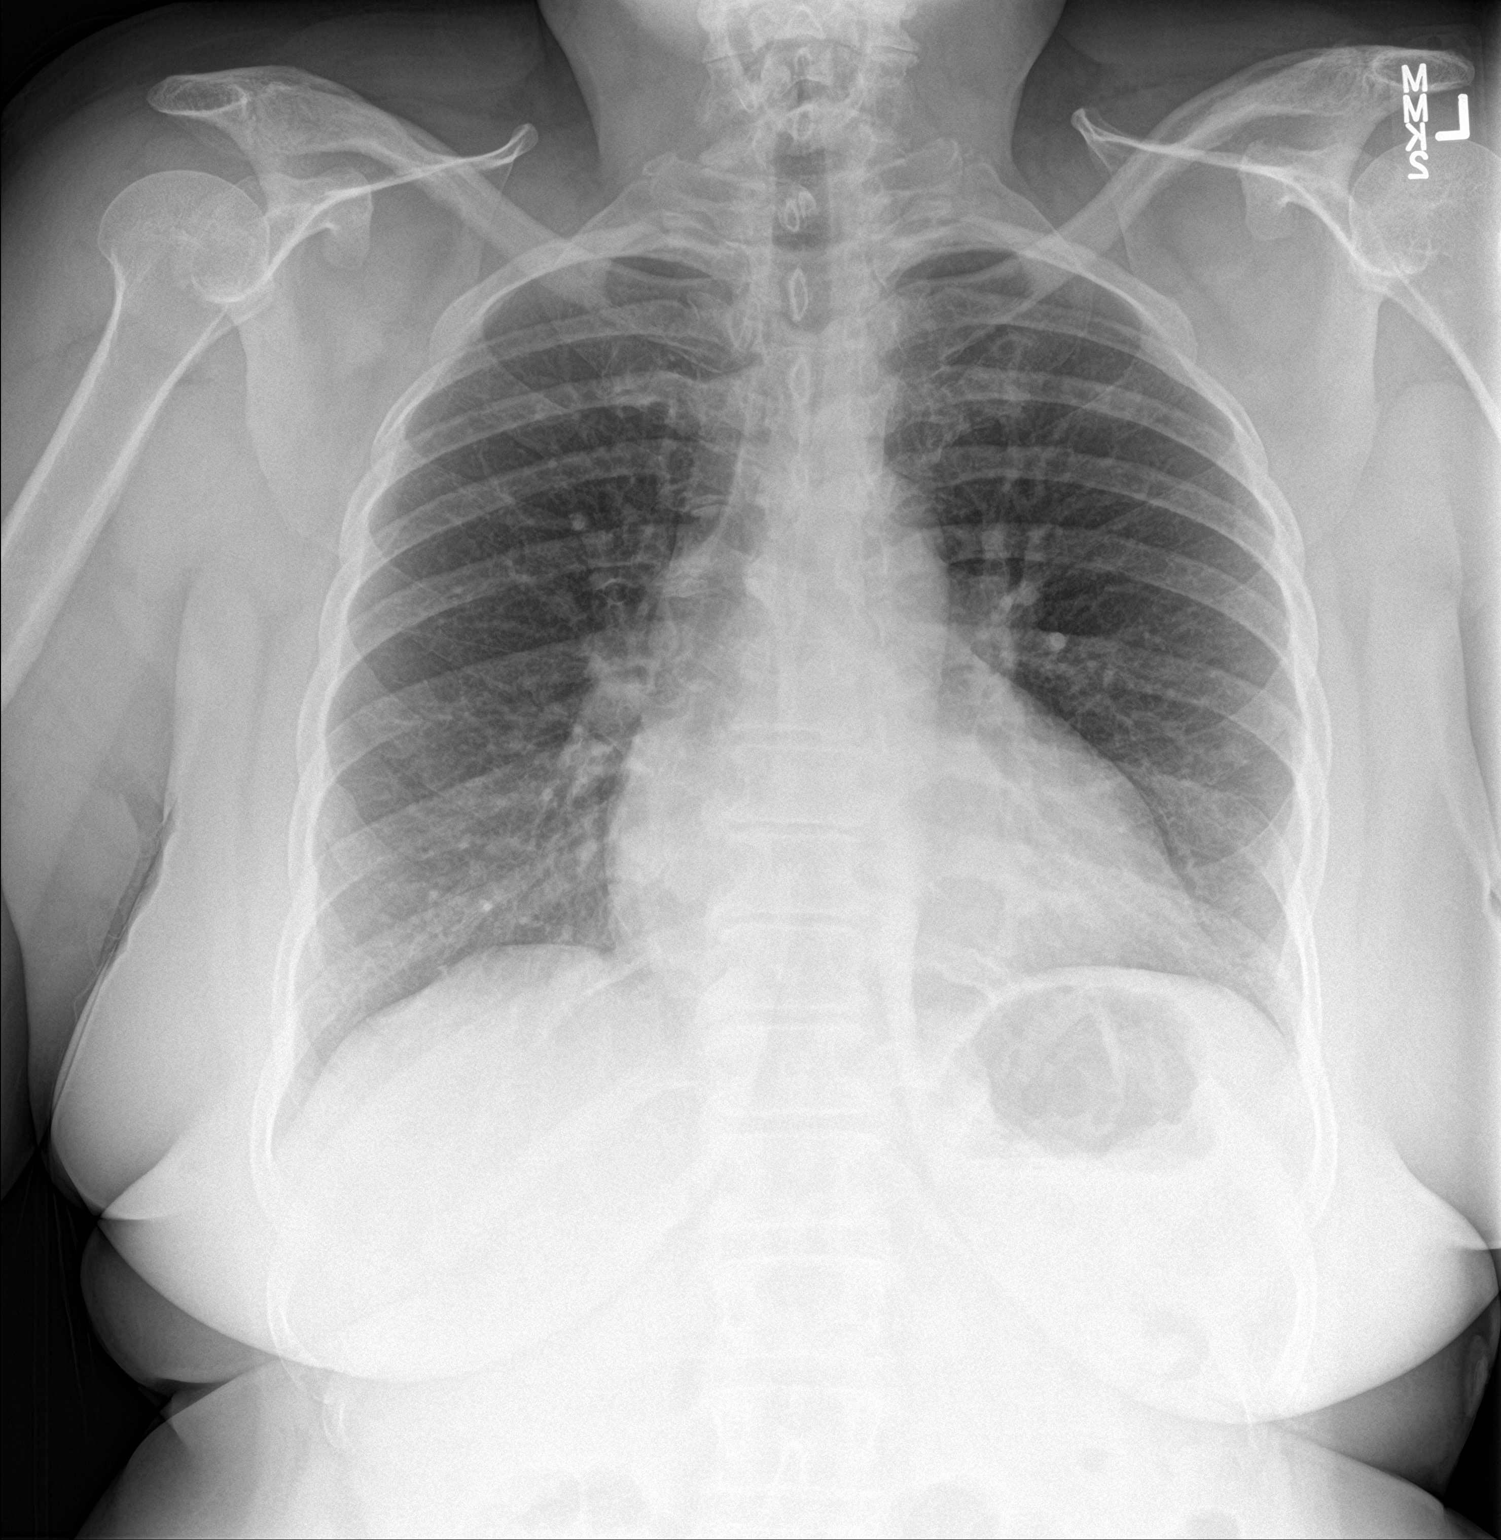

[chest lat]
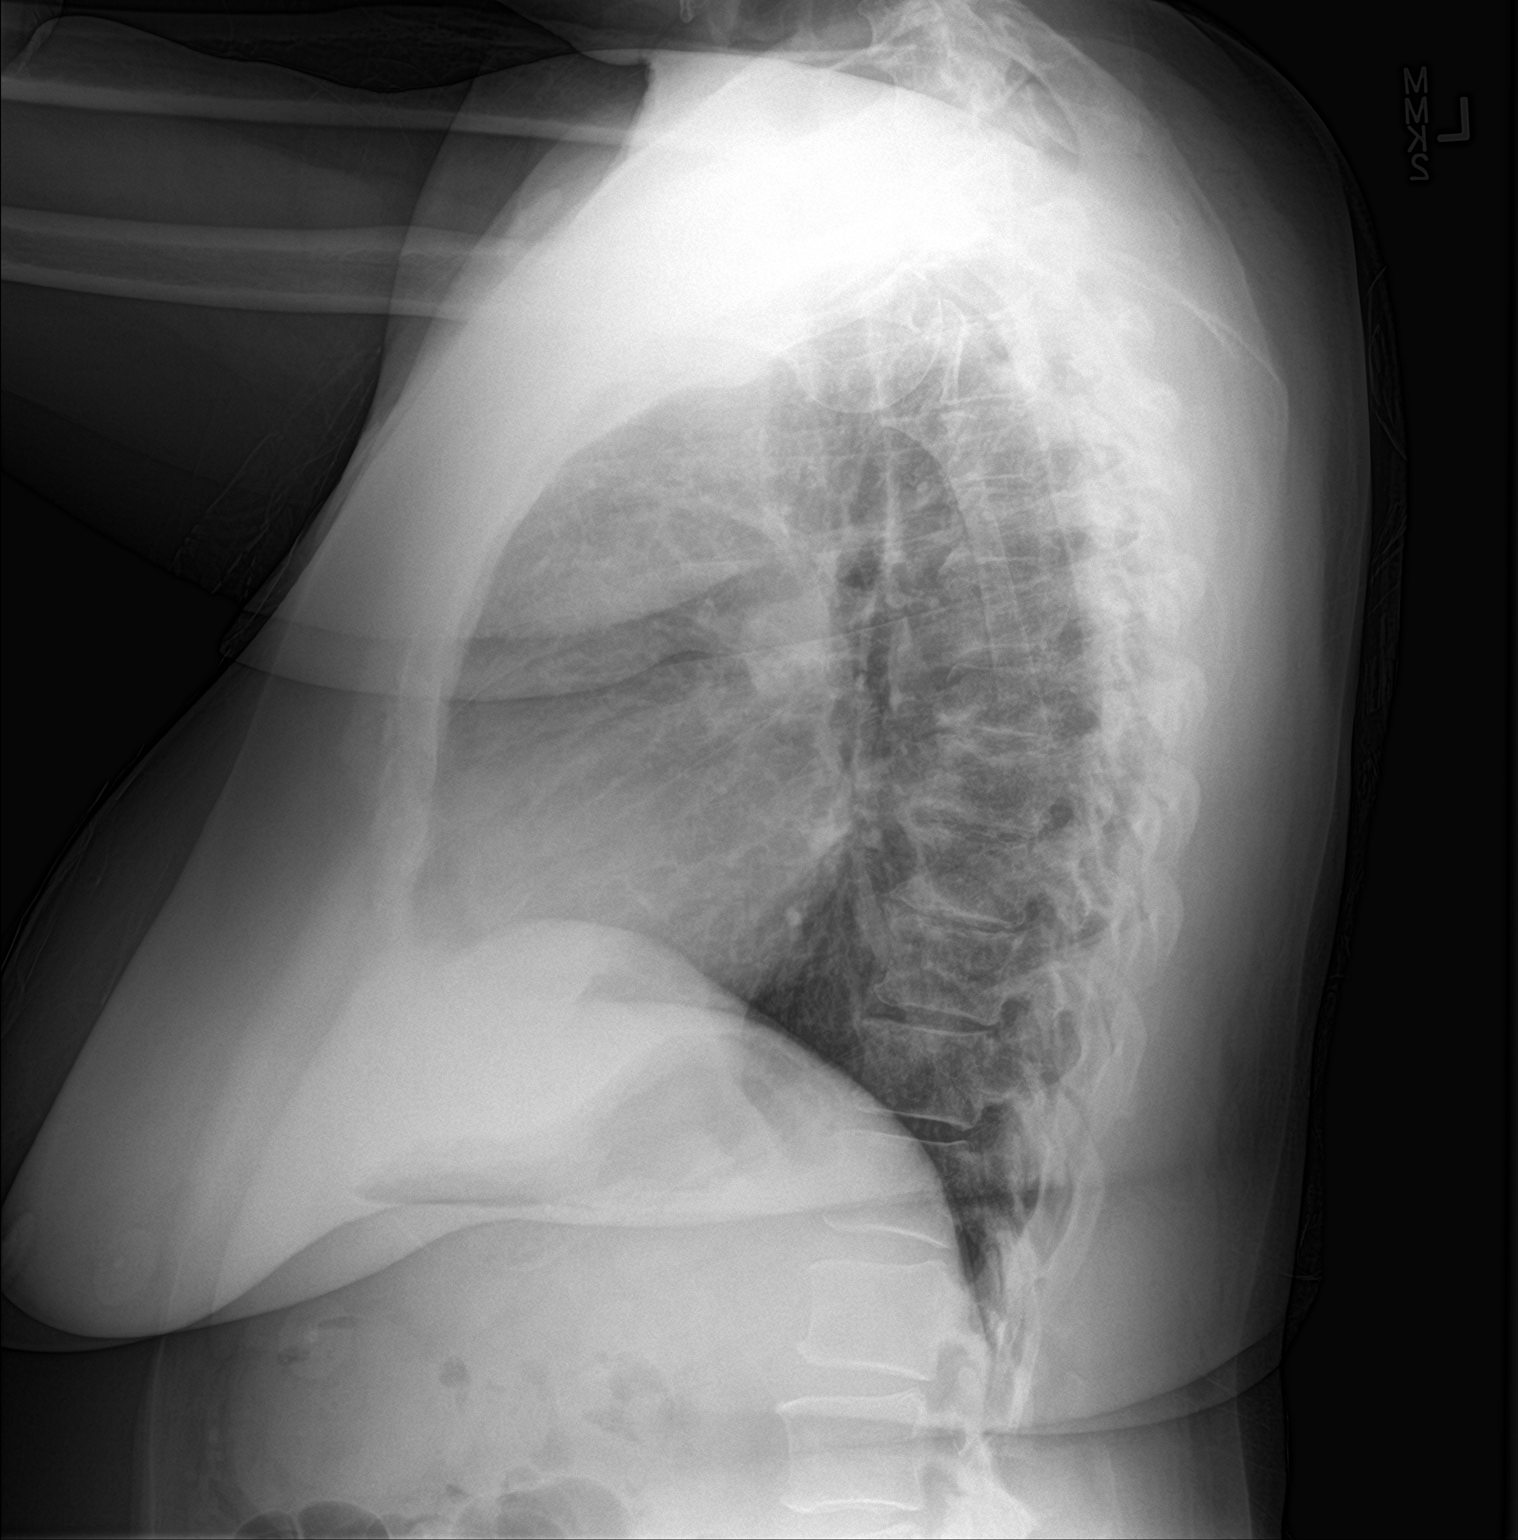

[2 of 2 positions shown; findings below may reference images not displayed]

FINDINGS: Atelectasis in the lung bases. No consolidation, features of edema,
pneumothorax, or effusion. Pulmonary vascularity is normally
distributed. The cardiomediastinal contours are unremarkable. No
acute osseous or soft tissue abnormality.
IMPRESSION: Atelectasis, otherwise no acute cardiopulmonary abnormality.

## 2020-06-22 IMAGING — CT CT NECK W/ CM
3 of 4 series · 13 of 33 positions shown, 16 images · IV contrast (Omni 300)
Comparison: Chest radiographs yesterday.
COMPARISON: Chest radiographs yesterday.

Addendum:
CLINICAL DATA: 57-year-old female with persistent laryngitis. Sore
throat for 2-3 weeks. Reportedly negative for [id number].

EXAM:
CT NECK WITH CONTRAST
TECHNIQUE: Multidetector CT imaging of the neck was performed using the
standard protocol following the bolus administration of intravenous
contrast.
CONTRAST:  75mL OMNIPAQUE IOHEXOL 300 MG/ML  SOLN

[Series 3: neck 2.0 st · axial · 0.44mm/px · z∈[-301,-155]mm · 5 of 111 slices shown, 7 images (1 of 3)]
[im 19/111  soft-tissue]
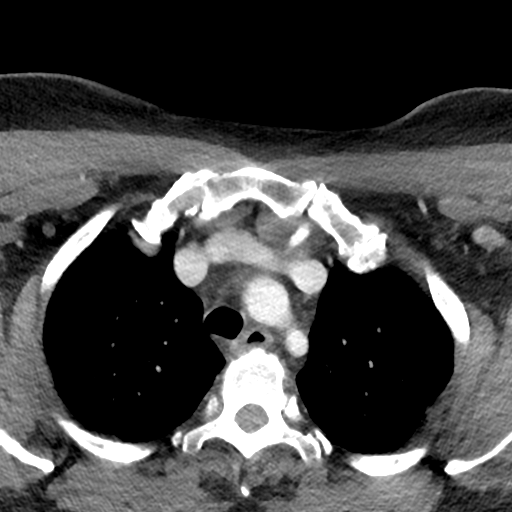
[im 19/111  bone]
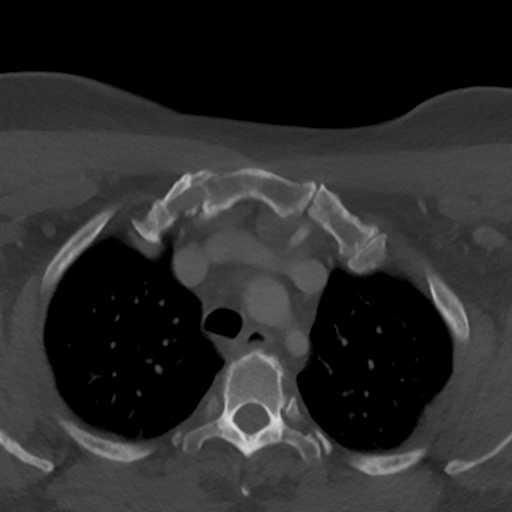
[im 37/111  bone]
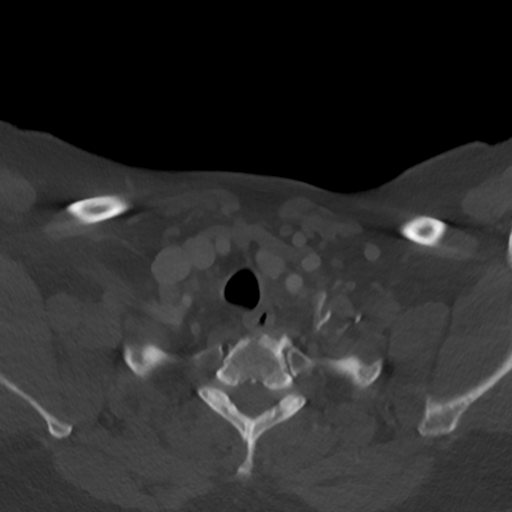
[im 56/111  bone]
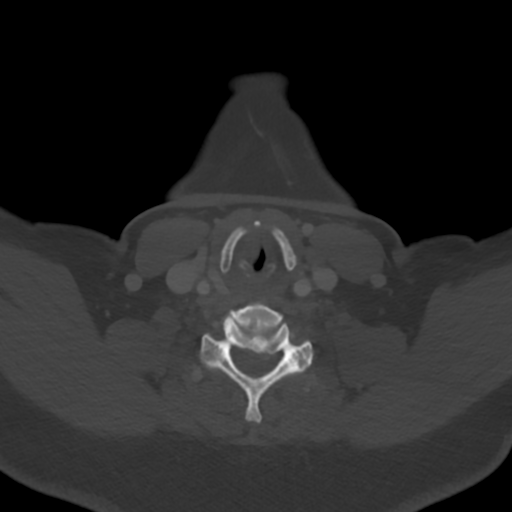
[im 74/111  bone]
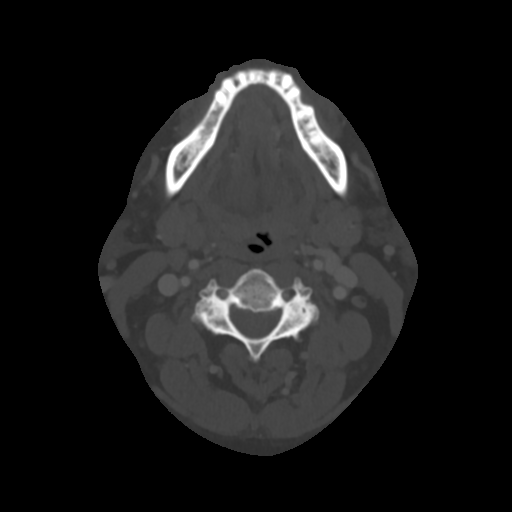
[im 92/111  soft-tissue]
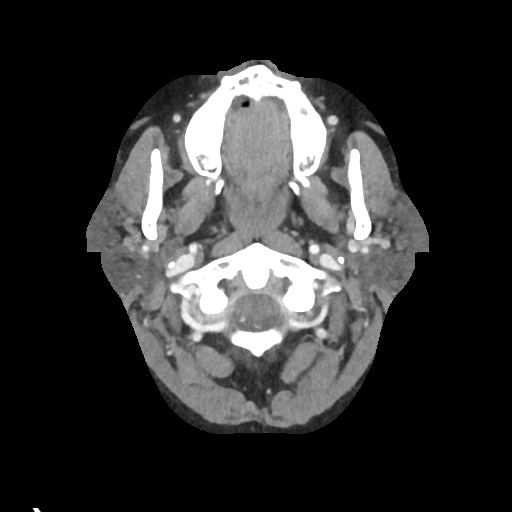
[im 92/111  bone]
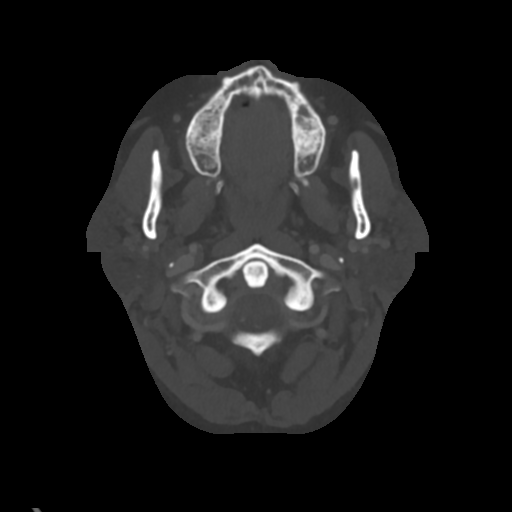

[Series 6: neck 2.0 st · sagittal · 0.43mm/px · 5 of 90 slices shown, 6 images (2 of 3)]
[im 30/90  bone]
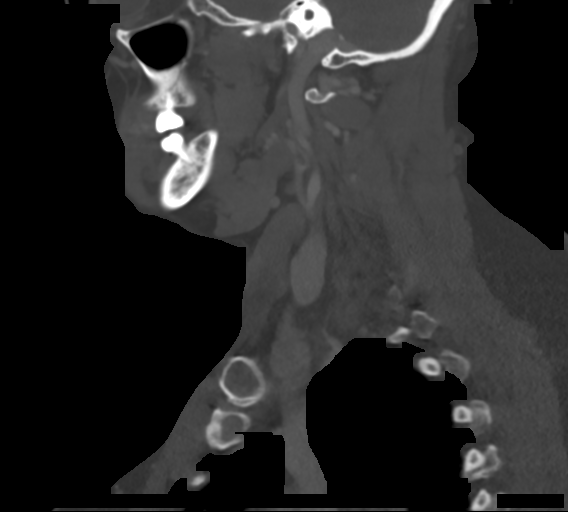
[im 38/90  bone]
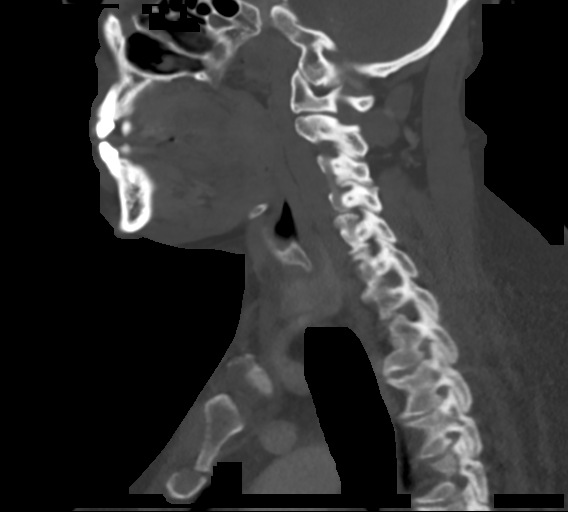
[im 45/90  soft-tissue]
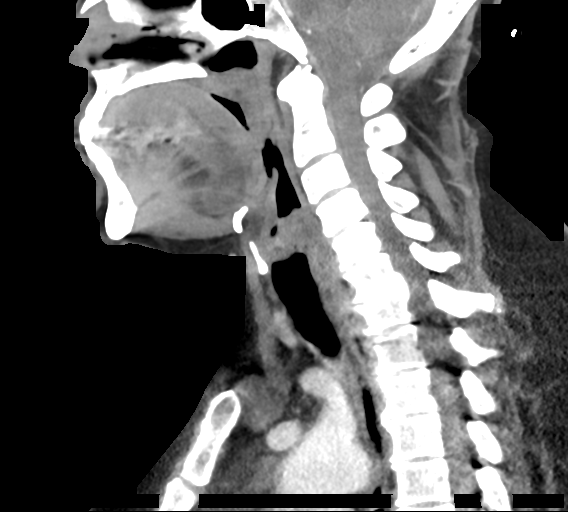
[im 45/90  bone]
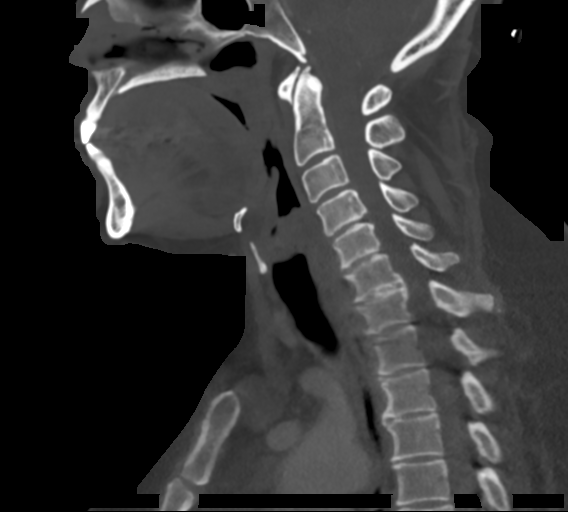
[im 52/90  bone]
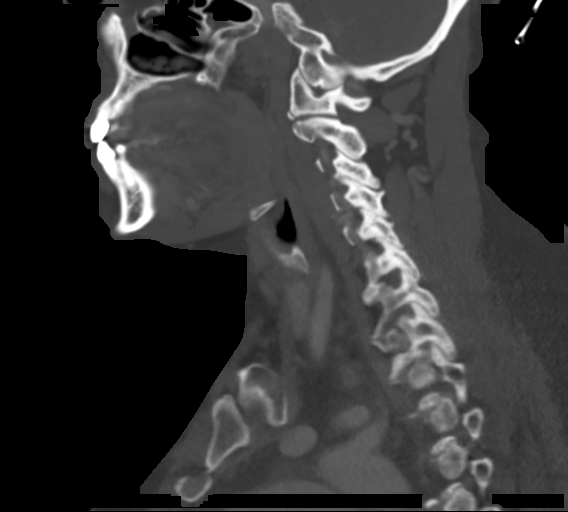
[im 60/90  bone]
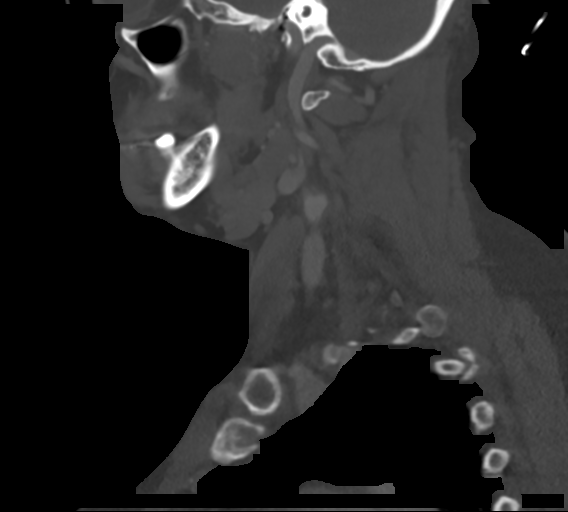

[Series 7: neck 2.0 st · coronal · 0.43mm/px · 3 of 121 slices shown (3 of 3)]
[im 25/121  bone]
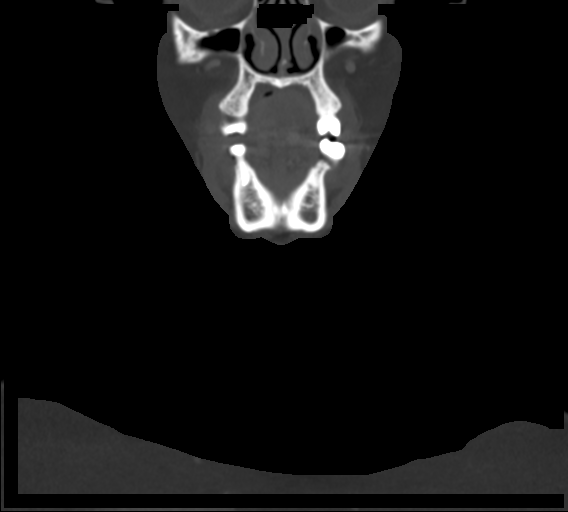
[im 49/121  bone]
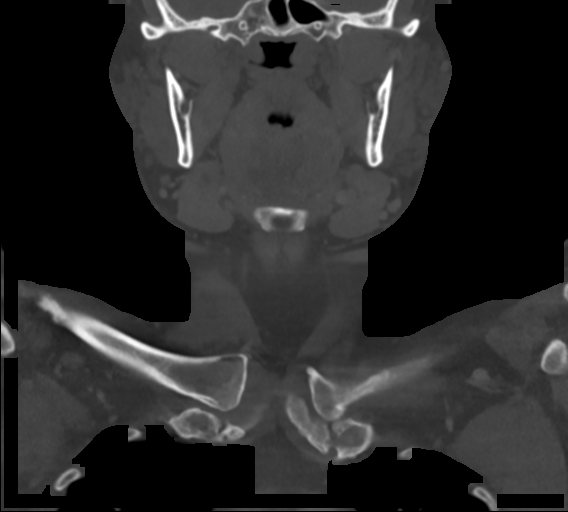
[im 73/121  bone]
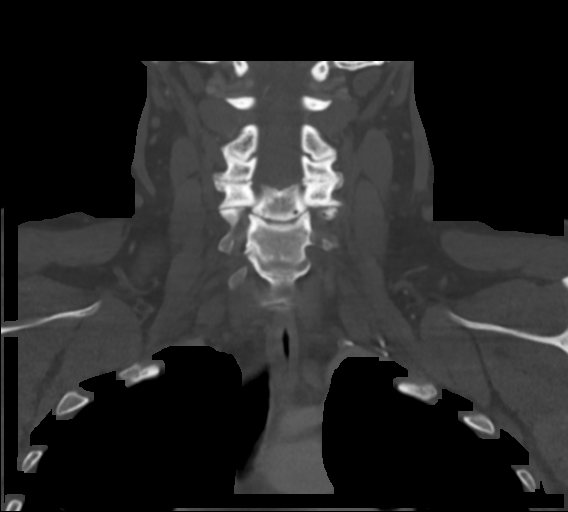

[13 of 33 positions shown; findings below may reference images not displayed]

FINDINGS: Pharynx and larynx: Irregular supraglottic larynx with nodular soft
tissue thickening greater on the right (coronal image 57), epicenter
at the false cords and AE folds. The abnormality encompasses an area
of about 12 millimeters diameter. The true cords have a more normal
appearance. There is apparent airway narrowing. The epiglottis is
relatively spared. The piriform sinuses are spared. There is
associated asymmetric enlargement of the left laryngeal ventricle on
series 3, image 54.

Pharyngeal soft tissue contours than are within normal limits.
Parapharyngeal and retropharyngeal spaces appear negative.

Salivary glands: Sublingual space, submandibular glands and parotid
glands are within normal limits.

Thyroid: Negative.

Lymph nodes: Bilateral cervical lymph nodes remain normal by size
criteria. There are level 3A nodes up to 6 millimeters short axis.
No heterogeneous nodes.

Vascular: Major vascular structures in the neck and at the skull
base are patent. Mild atherosclerosis of the proximal left ICA. The
left ICA also appears somewhat dominant.

Limited intracranial: Negative.

Visualized orbits: Negative.

Mastoids and visualized paranasal sinuses: Clear.

Skeleton: No acute or suspicious osseous lesion. Upper cervical
spine facet degeneration and lower cervical spine disc and endplate
degeneration. Chronic dental changes.

Upper chest: Negative subglottic trachea. Negative visible carina.
Upper lungs are clear. Negative superior mediastinum.
IMPRESSION: 1. Irregular supraglottic laryngeal mass with epicenter at the false
cords and eccentric to the right. Size is approximately 12 mm.
Recommend ENT follow-up for direct visualization and sampling.
2. Bilateral cervical lymph nodes remain within normal limits.
3. Negative neck CT otherwise.

ADDENDUM:
Study discussed by telephone with Dr. Grimfantasy on 11/27/2018 at 6877
hours. He advises that the patient presents without stridor.

*** End of Addendum ***
FINDINGS: Pharynx and larynx: Irregular supraglottic larynx with nodular soft
tissue thickening greater on the right (coronal image 57), epicenter
at the false cords and AE folds. The abnormality encompasses an area
of about 12 millimeters diameter. The true cords have a more normal
appearance. There is apparent airway narrowing. The epiglottis is
relatively spared. The piriform sinuses are spared. There is
associated asymmetric enlargement of the left laryngeal ventricle on
series 3, image 54.

Pharyngeal soft tissue contours than are within normal limits.
Parapharyngeal and retropharyngeal spaces appear negative.

Salivary glands: Sublingual space, submandibular glands and parotid
glands are within normal limits.

Thyroid: Negative.

Lymph nodes: Bilateral cervical lymph nodes remain normal by size
criteria. There are level 3A nodes up to 6 millimeters short axis.
No heterogeneous nodes.

Vascular: Major vascular structures in the neck and at the skull
base are patent. Mild atherosclerosis of the proximal left ICA. The
left ICA also appears somewhat dominant.

Limited intracranial: Negative.

Visualized orbits: Negative.

Mastoids and visualized paranasal sinuses: Clear.

Skeleton: No acute or suspicious osseous lesion. Upper cervical
spine facet degeneration and lower cervical spine disc and endplate
degeneration. Chronic dental changes.

Upper chest: Negative subglottic trachea. Negative visible carina.
Upper lungs are clear. Negative superior mediastinum.
IMPRESSION: 1. Irregular supraglottic laryngeal mass with epicenter at the false
cords and eccentric to the right. Size is approximately 12 mm.
Recommend ENT follow-up for direct visualization and sampling.
2. Bilateral cervical lymph nodes remain within normal limits.
3. Negative neck CT otherwise.

## 2023-03-02 DIAGNOSIS — C50919 Malignant neoplasm of unspecified site of unspecified female breast: Secondary | ICD-10-CM

## 2023-03-02 HISTORY — DX: Malignant neoplasm of unspecified site of unspecified female breast: C50.919

## 2023-10-15 ENCOUNTER — Other Ambulatory Visit: Payer: Self-pay

## 2023-10-15 ENCOUNTER — Emergency Department (HOSPITAL_COMMUNITY)
Admission: EM | Admit: 2023-10-15 | Discharge: 2023-10-15 | Disposition: A | Discharge status: Home / Self Care | Attending: Emergency Medicine | Admitting: Emergency Medicine

## 2023-10-15 ENCOUNTER — Encounter (HOSPITAL_COMMUNITY): Payer: Self-pay

## 2023-10-15 ENCOUNTER — Emergency Department (HOSPITAL_COMMUNITY)

## 2023-10-15 DIAGNOSIS — Z853 Personal history of malignant neoplasm of breast: Secondary | ICD-10-CM | POA: Diagnosis not present

## 2023-10-15 DIAGNOSIS — W19XXXA Unspecified fall, initial encounter: Secondary | ICD-10-CM | POA: Diagnosis not present

## 2023-10-15 DIAGNOSIS — M25512 Pain in left shoulder: Secondary | ICD-10-CM | POA: Insufficient documentation

## 2023-10-15 LAB — CBC WITH DIFFERENTIAL/PLATELET
Abs Immature Granulocytes: 0.01 K/uL (ref 0.00–0.07)
Basophils Absolute: 0 K/uL (ref 0.0–0.1)
Basophils Relative: 1 %
Eosinophils Absolute: 0 K/uL (ref 0.0–0.5)
Eosinophils Relative: 2 %
HCT: 37.5 % (ref 36.0–46.0)
Hemoglobin: 11.1 g/dL — ABNORMAL LOW (ref 12.0–15.0)
Immature Granulocytes: 0 %
Lymphocytes Relative: 45 %
Lymphs Abs: 1.2 K/uL (ref 0.7–4.0)
MCH: 25.8 pg — ABNORMAL LOW (ref 26.0–34.0)
MCHC: 29.6 g/dL — ABNORMAL LOW (ref 30.0–36.0)
MCV: 87 fL (ref 80.0–100.0)
Monocytes Absolute: 0.3 K/uL (ref 0.1–1.0)
Monocytes Relative: 11 %
Neutro Abs: 1.1 K/uL — ABNORMAL LOW (ref 1.7–7.7)
Neutrophils Relative %: 41 %
Platelets: 305 K/uL (ref 150–400)
RBC: 4.31 MIL/uL (ref 3.87–5.11)
RDW: 15 % (ref 11.5–15.5)
WBC: 2.6 K/uL — ABNORMAL LOW (ref 4.0–10.5)
nRBC: 0 % (ref 0.0–0.2)

## 2023-10-15 LAB — COMPREHENSIVE METABOLIC PANEL WITH GFR
ALT: 23 U/L (ref 0–44)
AST: 22 U/L (ref 15–41)
Albumin: 3.6 g/dL (ref 3.5–5.0)
Alkaline Phosphatase: 61 U/L (ref 38–126)
Anion gap: 10 (ref 5–15)
BUN: 22 mg/dL (ref 8–23)
CO2: 23 mmol/L (ref 22–32)
Calcium: 9.2 mg/dL (ref 8.9–10.3)
Chloride: 105 mmol/L (ref 98–111)
Creatinine, Ser: 0.82 mg/dL (ref 0.44–1.00)
GFR, Estimated: >60 mL/min (ref >60)
Glucose, Bld: 108 mg/dL — ABNORMAL HIGH (ref 70–99)
Potassium: 4.1 mmol/L (ref 3.5–5.1)
Sodium: 138 mmol/L (ref 135–145)
Total Bilirubin: 0.5 mg/dL (ref 0.0–1.2)
Total Protein: 6.9 g/dL (ref 6.5–8.1)

## 2023-10-15 MED ORDER — CYCLOBENZAPRINE HCL 10 MG PO TABS: 10.0000 mg | ORAL_TABLET | Freq: Two times a day (BID) | ORAL | 0 refills | Status: AC | PRN

## 2023-10-15 MED ORDER — HYDROCODONE-ACETAMINOPHEN 5-325 MG PO TABS: 1.0000 | ORAL_TABLET | Freq: Four times a day (QID) | ORAL | 0 refills | Status: AC | PRN

## 2023-10-15 NOTE — ED Provider Notes (Signed)
 [place] EMERGENCY DEPARTMENT AT [facility] Provider Note  CSN: [id number] Arrival date & time: 10/15/23 1113  Chief Complaint(s) Shoulder Pain  HPI Jasmine Lane is a 63 y.o. female with past medical history as below, significant for breast cancer, nephrolithiasis who presents to the ED with complaint of left shoulder pain  Patient with left-sided shoulder pain over the last 2 weeks.  She has been taking Tylenol /Motrin  without much relief.  She thinks she may have injured her shoulder when she fell few weeks ago but she is unsure.  She denies any numbness or tingling to her fingers.  She has full range of motion to her upper extremities but does have some discomfort when moving her left arm, particular to the posterior aspect of her left shoulder  Patient also has history of breast cancer, she was admitted about a month ago secondary to a presumed infected Port-A-Cath.  She has not restarted chemotherapy since then and does not want to follow back up with [facility] or with her oncologist.  She also does not think she really has breast cancer.  She has no significant chest pain, no dyspnea, nausea or vomiting.  No significant headaches.  Past Medical History Past Medical History:  Diagnosis Date   Breast cancer (HCC) 2025   Kidney stone    Patient Active Problem List   Diagnosis Date Noted   Hoarseness of voice 01/18/2019   Home Medication(s) Prior to Admission medications   Medication Sig Start Date End Date Taking? Authorizing Provider  cyclobenzaprine  (FLEXERIL ) 10 MG tablet Take 1 tablet (10 mg total) by mouth 2 (two) times daily as needed for muscle spasms. 10/15/23  Yes Elnor Savant A, DO  HYDROcodone -acetaminophen  (NORCO/VICODIN) 5-325 MG tablet Take 1 tablet by mouth every 6 (six) hours as needed. 10/15/23  Yes Elnor Savant A, DO  ibuprofen  (ADVIL ) 200 MG tablet Take 400-600 mg by mouth every 6 (six) hours as needed for moderate pain.    [provider]                                                                                                                                     Past Surgical History History reviewed. No pertinent surgical history. Family History History reviewed. No pertinent family history.  Social History Social History   Tobacco Use   Smoking status: Never   Smokeless tobacco: Never  Substance Use Topics   Alcohol use: No   Drug use: No   Allergies Patient has no known allergies.  Review of Systems A thorough review of systems was obtained and all systems are negative except as noted in the HPI and PMH.   Physical Exam Vital Signs  I have reviewed the triage vital signs BP (!) 174/81   Pulse 75   Temp 98.7 F (37.1 C)   Resp 18   Ht 5' 7 (1.702 m)   Wt 102.1 kg  SpO2 100%   BMI 35.24 kg/m  Physical Exam Vitals and nursing note reviewed.  Constitutional:      General: She is not in acute distress.    Appearance: Normal appearance.  HENT:     Head: Normocephalic and atraumatic.     Right Ear: External ear normal.     Left Ear: External ear normal.     Nose: Nose normal.     Mouth/Throat:     Mouth: Mucous membranes are moist.  Eyes:     General: No scleral icterus.       Right eye: No discharge.        Left eye: No discharge.  Cardiovascular:     Rate and Rhythm: Normal rate and regular rhythm.     Pulses: Normal pulses.     Heart sounds: Normal heart sounds.  Pulmonary:     Effort: Pulmonary effort is normal. No respiratory distress.     Breath sounds: Normal breath sounds. No stridor.  Abdominal:     General: Abdomen is flat. There is no distension.     Palpations: Abdomen is soft.     Tenderness: There is no abdominal tenderness.  Musculoskeletal:       Arms:     Cervical back: No rigidity.     Right lower leg: No edema.     Left lower leg: No edema.     Comments: No significant axillary lymphadenopathy that I am able to palpate  She has TTP on the superior aspect of her  scapula  Upper extremities are NVI bilateral  Full range of motion upper extremities  No pain to neck or midline spine  Skin:    General: Skin is warm and dry.     Capillary Refill: Capillary refill takes less than 2 seconds.  Neurological:     Mental Status: She is alert.  Psychiatric:        Mood and Affect: Mood normal.        Behavior: Behavior normal. Behavior is cooperative.     ED Results and Treatments Labs (all labs ordered are listed, but only abnormal results are displayed) Labs Reviewed  CBC WITH DIFFERENTIAL/PLATELET - Abnormal; Notable for the following components:      Result Value   WBC 2.6 (*)    Hemoglobin 11.1 (*)    MCH 25.8 (*)    MCHC 29.6 (*)    Neutro Abs 1.1 (*)    All other components within normal limits  COMPREHENSIVE METABOLIC PANEL WITH GFR - Abnormal; Notable for the following components:   Glucose, Bld 108 (*)    All other components within normal limits                                                                                                                          Radiology No results found.   Pertinent labs & imaging results that were available during my care of the patient were reviewed by me and  considered in my medical decision making (see MDM for details).  Medications Ordered in ED Medications - No data to display                                                                                                                                   Procedures Procedures  (including critical care time)  Medical Decision Making / ED Course    Medical Decision Making:    Jasmine Lane is a 63 y.o. female with past medical history as below, significant for breast cancer, nephrolithiasis who presents to the ED with complaint of left shoulder pain. The complaint involves an extensive differential diagnosis and also carries with it a high risk of complications and morbidity.  Serious etiology was considered. Ddx includes but is  not limited to: Sprain, strain, fracture, dislocation, metastatic disease, etc.  Complete initial physical exam performed, notably the patient was in no acute distress, sitting comfortably on stretcher.    Reviewed and confirmed nursing documentation for past medical history, family history, social history.  Vital signs reviewed.    Left shoulder pain > - Pain ongoing for around 2 weeks, questionable injury preceding the pain. - Screening labs obtained in triage are reassuring, she does have leukopenia.  No fever vital signs stable otherwise, doubt acute infectious pathology at this time.  - X-ray reassuring, no fracture or dislocation - No overt lymphadenopathy to her left axilla, no overt evidence of metastatic disease in her shoulder - Placed in shoulder sling which patient feels immediately improved her discomfort.  Analgesia for home, follow-up orthopedics - She has no chest pain, dyspnea or anginal equivalent  Breast cancer> - Patient stopped treatment around a month ago after she had a presumed infection associate with her Port-A-Cath - She is requesting referral to a different oncologist in the [facility].  I gave her information regarding oncology on-call.  Strongly encourage patient to follow-up with oncology given her known diagnosis of breast cancer and lack of treatment over the past month  Patient in no distress and overall condition is stable. Detailed discussions were had with the patient/guardian regarding current findings, and need for close f/u with PCP or on call doctor. The patient/guardian has been instructed to return immediately if the symptoms worsen in any way for re-evaluation. Patient/guardian verbalized understanding and is in agreement with current care plan. All questions answered prior to discharge.                      Additional history obtained: -Additional history obtained from na -External records from outside source obtained and reviewed  including: Chart review including previous notes, labs, imaging, consultation notes including  Medications, prior admission   Lab Tests: -I ordered, reviewed, and interpreted labs.   The pertinent results include:   Labs Reviewed  CBC WITH DIFFERENTIAL/PLATELET - Abnormal; Notable for the following components:      Result Value  WBC 2.6 (*)    Hemoglobin 11.1 (*)    MCH 25.8 (*)    MCHC 29.6 (*)    Neutro Abs 1.1 (*)    All other components within normal limits  COMPREHENSIVE METABOLIC PANEL WITH GFR - Abnormal; Notable for the following components:   Glucose, Bld 108 (*)    All other components within normal limits    Notable for leukopenia, anemia similar to prior  EKG   EKG Interpretation Date/Time:    Ventricular Rate:    PR Interval:    QRS Duration:    QT Interval:    QTC Calculation:   R Axis:      Text Interpretation:           Imaging Studies ordered: I ordered imaging studies including x-ray shoulder I independently visualized the following imaging with scope of interpretation limited to determining acute life threatening conditions related to emergency care; findings noted above I agree with the radiologist interpretation If any imaging was obtained with contrast I closely monitored patient for any possible adverse reaction a/w contrast administration in the emergency department   Medicines ordered and prescription drug management: Meds ordered this encounter  Medications   HYDROcodone -acetaminophen  (NORCO/VICODIN) 5-325 MG tablet    Sig: Take 1 tablet by mouth every 6 (six) hours as needed.    Dispense:  10 tablet    Refill:  0   cyclobenzaprine  (FLEXERIL ) 10 MG tablet    Sig: Take 1 tablet (10 mg total) by mouth 2 (two) times daily as needed for muscle spasms.    Dispense:  20 tablet    Refill:  0    -I have reviewed the patients home medicines and have made adjustments as needed   Consultations Obtained: Not applicable  Cardiac  Monitoring: Continuous pulse oximetry interpreted by myself, 100% on RA.    Social Determinants of Health:  Diagnosis or treatment significantly limited by social determinants of health: obesity   Reevaluation: After the interventions noted above, I reevaluated the patient and found that they have improved  Co morbidities that complicate the patient evaluation  Past Medical History:  Diagnosis Date   Breast cancer (HCC) 2025   Kidney stone       Dispostion: Disposition decision including need for hospitalization was considered, and patient discharged from emergency department.    Final Clinical Impression(s) / ED Diagnoses Final diagnoses:  Acute pain of left shoulder        Elnor Jayson LABOR, DO 10/17/23 [id number]

## 2023-10-15 NOTE — Discharge Instructions (Addendum)
 It was a pleasure caring for you today in the emergency department.  Be sure to follow up with oncology regarding your history of breast cancer, you can see Dr Loretha if you would like a 2nd opinion but please be sure to stay in touch with oncology as breast cancer can spread very rapidly   Please return to the emergency department for any worsening or worrisome symptoms.

## 2023-10-15 NOTE — ED Provider Triage Note (Signed)
 Emergency Medicine Provider Triage Evaluation Note  Jasmine Lane , a 63 y.o. female  was evaluated in triage.  Pt complains of shoulder pain.  Patient reports ongoing issues with shoulder pain in the left shoulder for the last month.  States that she was hospitalized about 1 month ago [facility] for concerns of sepsis secondary to infection of Port-A-Cath that she is receiving chemotherapy for for breast cancer.  Denies any injury, trauma, or falls that she can recall.  No reported chest pain or shortness of breath.  Review of Systems  Positive: As above Negative: As above  Physical Exam  BP (!) 174/97 (BP Location: Right Arm)   Pulse 95   Temp 98.8 F (37.1 C)   Resp 20   Ht 5' 7 (1.702 m)   Wt 102.1 kg   SpO2 98%   BMI 35.24 kg/m  Gen:   Awake, no distress   Resp:  Normal effort  MSK:   Moves extremities without difficulty.  Extension of the left arm elicits no significant pain.  Some crepitus felt in the left shoulder. Other:  No abnormal heart or lung sounds.  Medical Decision Making  Medically screening exam initiated at 11:41 AM.  Appropriate orders placed.  Gladyse Corvin was informed that the remainder of the evaluation will be completed by another provider, this initial triage assessment does not replace that evaluation, and the importance of remaining in the ED until their evaluation is complete.     Shamika Pedregon A, PA-C 10/15/23 1142

## 2023-10-15 NOTE — ED Triage Notes (Signed)
 Pt arrived POV from home c/o left shoulder pain that has been constant for 2 weeks. Pt has been taking tylenol  and ibuprofen  with no relief. Pt is able to raise her arm and has normal range of motion. Pt also states it seems like it might be getting better but it is taking a long time and she is worried. Her PCP can't see her until Oct.

## 2023-10-17 ENCOUNTER — Telehealth: Payer: Self-pay | Admitting: General Practice

## 2023-10-17 ENCOUNTER — Telehealth: Payer: Self-pay | Admitting: Hematology and Oncology

## 2023-10-17 NOTE — Telephone Encounter (Signed)
 Rane contacted us  to schedule an appointment with Dr. Loretha. I informed Bani that there is no referral in the system for her. Georgianna stated that she has a referral. I asked Hasna if she could bring it to our office, Pearly stated with frustration that she lives in [facility] and she cannot come to drop off her referral and for an appointment. I transferred the call to [facility] who is assigned to the oncology referrals.

## 2023-10-17 NOTE — Telephone Encounter (Signed)
 Got a call from the patient requesting an appointment for a second [facility] opinion. A referral was created for the patient; however, it was noted that the insurance is OON. Informed the patient that I would ask one of our financial advisors to give her a call.

## 2023-11-19 ENCOUNTER — Emergency Department (HOSPITAL_COMMUNITY): Payer: Worker's Compensation

## 2023-11-19 ENCOUNTER — Emergency Department (HOSPITAL_COMMUNITY)
Admission: EM | Admit: 2023-11-19 | Discharge: 2023-11-19 | Disposition: A | Discharge status: Home / Self Care | Payer: Worker's Compensation | Attending: Emergency Medicine | Admitting: Emergency Medicine

## 2023-11-19 ENCOUNTER — Other Ambulatory Visit: Payer: Self-pay

## 2023-11-19 DIAGNOSIS — S0990XA Unspecified injury of head, initial encounter: Secondary | ICD-10-CM | POA: Insufficient documentation

## 2023-11-19 DIAGNOSIS — Y990 Civilian activity done for income or pay: Secondary | ICD-10-CM | POA: Diagnosis not present

## 2023-11-19 DIAGNOSIS — W19XXXA Unspecified fall, initial encounter: Secondary | ICD-10-CM

## 2023-11-19 DIAGNOSIS — W01190A Fall on same level from slipping, tripping and stumbling with subsequent striking against furniture, initial encounter: Secondary | ICD-10-CM | POA: Insufficient documentation

## 2023-11-19 NOTE — ED Notes (Signed)
 Pt was given discharge instructions and verbalized understanding. Opportunity given to ask questions.

## 2023-11-19 NOTE — ED Triage Notes (Addendum)
 In addition to triage note, pt had no LOC and has been off blood thinners x 3 wks. Also c/o pain to L breast where she hit the pt's bed rail before landing on the floor. Pt has hematoma to R forehead

## 2023-11-19 NOTE — Discharge Instructions (Signed)
 Your evaluation today has been largely reassuring.  But, it is important that you monitor your condition carefully, and do not hesitate to return to the ED if you develop new, or concerning changes in your condition.  The hematoma on your forehead will resolve over the next few days.  Please use Tylenol , ice for relief.  Otherwise, please follow-up with your physician for appropriate ongoing care.

## 2023-11-19 NOTE — ED Provider Notes (Signed)
  EMERGENCY DEPARTMENT AT [facility] Provider Note   CSN: [id number] Arrival date & time: 11/19/23  9590     Patient presents with: Jasmine Lane   Jasmine Lane is a 63 y.o. female.   HPI Patient presents after a fall with head trauma. 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  She notes that upon entering the patient's room she slipped, struck her head against a bed rail, floor, while falling.  No loss of consciousness, no subsequent vision changes, change in ambulatory status, weakness in any extremity, chest pain, nausea, vomiting, or confusion. She did sustain a large hematoma to her forehead notes that this is painful.     Prior to Admission medications   Medication Sig Start Date End Date Taking? Authorizing Provider  cyclobenzaprine  (FLEXERIL ) 10 MG tablet Take 1 tablet (10 mg total) by mouth 2 (two) times daily as needed for muscle spasms. 10/15/23   Elnor Jayson LABOR, DO  HYDROcodone -acetaminophen  (NORCO/VICODIN) 5-325 MG tablet Take 1 tablet by mouth every 6 (six) hours as needed. 10/15/23   Elnor Jayson LABOR, DO  ibuprofen  (ADVIL ) 200 MG tablet Take 400-600 mg by mouth every 6 (six) hours as needed for moderate pain.    [provider]    Allergies: Patient has no known allergies.    Review of Systems  Updated Vital Signs BP 139/71 (BP Location: Right Arm)   Pulse 82   Temp (!) 97.4 F (36.3 C) (Oral)   Resp 17   Wt 102.1 kg   SpO2 98%   BMI 35.25 kg/m   Physical Exam Vitals and nursing note reviewed.  Constitutional:      General: She is not in acute distress.    Appearance: She is well-developed.  HENT:     Head: Normocephalic.   Eyes:     Conjunctiva/sclera: Conjunctivae normal.  Cardiovascular:     Rate and Rhythm: Normal rate and regular rhythm.     Pulses: Normal pulses.  Pulmonary:     Effort: Pulmonary effort is normal. No respiratory distress.     Breath sounds: No stridor.  Abdominal:     General: There is no distension.   Musculoskeletal:     Cervical back: Normal range of motion and neck supple. No rigidity or tenderness.  Skin:    General: Skin is warm and dry.  Neurological:     General: No focal deficit present.     Mental Status: She is alert and oriented to person, place, and time.     Cranial Nerves: No cranial nerve deficit.     Motor: No weakness.     Comments: Good symmetric strength upper extremities, no facial asymmetry, speech is clear  Psychiatric:        Mood and Affect: Mood normal.     (all labs ordered are listed, but only abnormal results are displayed) Labs Reviewed - No data to display  EKG: None  Radiology: CT Cervical Spine Wo Contrast Result Date: 11/19/2023 CLINICAL DATA:  63 year old female status post fall at work, struck head on furniture. Head and neck pain. History of breast cancer. EXAM: CT CERVICAL SPINE WITHOUT CONTRAST TECHNIQUE: Multidetector CT imaging of the cervical spine was performed without intravenous contrast. Multiplanar CT image reconstructions were also generated. RADIATION DOSE REDUCTION: This exam was performed according to the departmental dose-optimization program which includes automated exposure control, adjustment of the mA and/or kV according to patient size and/or use of iterative reconstruction technique. COMPARISON:  Head CT today.  Neck CT 11/27/2018. FINDINGS: Alignment: Stable since 2020. Mild reversal of the lower cervical lordosis. Cervicothoracic junction alignment is within normal limits. Bilateral posterior element alignment is within normal limits. Skull base and vertebrae: Bone mineralization stable and within normal limits. Visualized skull base is intact. No atlanto-occipital dissociation. C1 and C2 appear intact and aligned. 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 No acute osseous abnormality identified. Soft tissues and spinal canal: No prevertebral fluid or swelling. No visible canal  hematoma. Mild residual asymmetry of the left supraglottic larynx, but the nodular mass-like area identified in 2020 is no longer present (series 8, image 5 today). Cervical lymph nodes are stable or slightly decreased since 2020. Otherwise negative visible noncontrast neck. Disc levels: Interval degenerative ankylosis of the C3-C4 facets, left C4-C5 facets, C6-C7 disc space and facets. Evidence of developing facet ankylosis on the left at C5-C6. Underlying chronic disc and endplate degeneration stable since 2020. Upper chest: Visible upper thoracic levels appear stable and intact. Visible noncontrast thoracic inlet appears stable since 2020, within normal limits. IMPRESSION: 1. No acute traumatic injury identified in the cervical spine. 2. 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 3. Improved CT appearance of the supraglottic larynx since 2020. Electronically Signed   By: VEAR Hurst M.D.   On: 11/19/2023 04:58   CT Head Wo Contrast Result Date: 11/19/2023 CLINICAL DATA:  63 year old female status post fall at work, struck head on furniture. Head and neck pain. History of breast cancer. EXAM: CT HEAD WITHOUT CONTRAST TECHNIQUE: Contiguous axial images were obtained from the base of the skull through the vertex without intravenous contrast. RADIATION DOSE REDUCTION: This exam was performed according to the departmental dose-optimization program which includes automated exposure control, adjustment of the mA and/or kV according to patient size and/or use of iterative reconstruction technique. COMPARISON:  Brain MRI 08/22/2023.  Head CT 08/17/2023. FINDINGS: Brain: Cerebral volume is stable, within normal limits for age. No midline shift, ventriculomegaly, mass effect, evidence of mass lesion, intracranial hemorrhage or evidence of cortically based acute infarction. Gray-white matter differentiation is within normal limits throughout  the brain. Vascular: No suspicious intracranial vascular hyperdensity. Calcified atherosclerosis at the skull base. Skull: Stable, intact.  No acute osseous abnormality identified. Sinuses/Orbits: Visualized paranasal sinuses and mastoids are stable and well aerated. Other: Right anterior forehead scalp hematoma up to 10 mm in thickness, supraorbital. No scalp soft tissue gas. Underlying right frontal bone appears intact. Postoperative changes to the globes, visible orbits soft tissues appears stable and negative. IMPRESSION: 1. Right forehead scalp hematoma.  No underlying skull fracture. 2. Stable and normal for age noncontrast CT appearance of the brain. Electronically Signed   By: VEAR Hurst M.D.   On: 11/19/2023 04:53     Procedures   Medications Ordered in the ED - No data to display                                  Medical Decision Making Adult female presents after fall that occurred at work.  Patient is awake, alert, neurologically unremarkable.  Given the size of the hematoma, imaging ordered from triage. Pulse ox 99% room air normal patient's denial of any neurocomplaints, any evidence for hemodynamic instability both reassuring. Head CT reviewed, neck CT reviewed, both reassuring, no intracranial abnormality, no cervical spine injury.  We  discussed patient's injury on repeat exam, after review the images, patient discharged in stable condition.  Amount and/or Complexity of Data Reviewed External Data Reviewed: notes. Radiology: independent interpretation performed. Decision-making details documented in ED Course.  Risk Decision regarding hospitalization.   Final diagnoses:  Fall, initial encounter  Injury of head, initial encounter    ED Discharge Orders     None          Garrick Charleston, MD 11/19/23 [id number]

## 2023-11-19 NOTE — ED Triage Notes (Signed)
 Pt arrived POV from work c/o a fall. Pt states she slipped in a pts room and fell and hit her head on the dresser. Pt states she took extra strength tylenol  and ibuprofen  before coming. Pt is c/o neck pain and her head throbbing at this time.
